# Patient Record
Sex: Female | Born: 1940 | Race: White | Hispanic: No | Smoking: Never smoker
Health system: Southern US, Community
[De-identification: ages and names within clinical notes are randomized; demographics above are authoritative.]

## PROBLEM LIST (undated history)

## (undated) DIAGNOSIS — T7840XA Allergy, unspecified, initial encounter: Secondary | ICD-10-CM

## (undated) DIAGNOSIS — E785 Hyperlipidemia, unspecified: Secondary | ICD-10-CM

## (undated) DIAGNOSIS — M129 Arthropathy, unspecified: Secondary | ICD-10-CM

## (undated) DIAGNOSIS — I10 Essential (primary) hypertension: Secondary | ICD-10-CM

## (undated) DIAGNOSIS — J45909 Unspecified asthma, uncomplicated: Secondary | ICD-10-CM

## (undated) DIAGNOSIS — K449 Diaphragmatic hernia without obstruction or gangrene: Secondary | ICD-10-CM

## (undated) DIAGNOSIS — M1990 Unspecified osteoarthritis, unspecified site: Secondary | ICD-10-CM

## (undated) DIAGNOSIS — F411 Generalized anxiety disorder: Secondary | ICD-10-CM

## (undated) HISTORY — DX: Allergy, unspecified, initial encounter: T78.40XA

## (undated) HISTORY — DX: Generalized anxiety disorder: F41.1

## (undated) HISTORY — PX: TONSILLECTOMY: SUR1361

## (undated) HISTORY — DX: Arthropathy, unspecified: M12.9

## (undated) HISTORY — PX: COLONOSCOPY: SHX174

## (undated) HISTORY — DX: Essential (primary) hypertension: I10

## (undated) HISTORY — PX: LUMBAR DISC SURGERY: SHX700

## (undated) HISTORY — DX: Hyperlipidemia, unspecified: E78.5

## (undated) HISTORY — PX: ABDOMINAL HYSTERECTOMY: SHX81

## (undated) HISTORY — PX: CARPAL TUNNEL RELEASE: SHX101

## (undated) HISTORY — PX: APPENDECTOMY: SHX54

## (undated) HISTORY — DX: Unspecified asthma, uncomplicated: J45.909

## (undated) HISTORY — DX: Diaphragmatic hernia without obstruction or gangrene: K44.9

## (undated) HISTORY — DX: Unspecified osteoarthritis, unspecified site: M19.90

---

## 1999-07-16 ENCOUNTER — Other Ambulatory Visit: Admission: RE | Admit: 1999-07-16 | Discharge: 1999-07-16 | Payer: Self-pay | Admitting: Internal Medicine

## 1999-07-16 ENCOUNTER — Encounter (INDEPENDENT_AMBULATORY_CARE_PROVIDER_SITE_OTHER): Payer: Self-pay | Admitting: Specialist

## 2001-03-30 ENCOUNTER — Other Ambulatory Visit: Admission: RE | Admit: 2001-03-30 | Discharge: 2001-03-30 | Payer: Self-pay | Admitting: Obstetrics and Gynecology

## 2002-03-08 ENCOUNTER — Other Ambulatory Visit: Admission: RE | Admit: 2002-03-08 | Discharge: 2002-03-08 | Payer: Self-pay | Admitting: Obstetrics and Gynecology

## 2002-03-11 ENCOUNTER — Emergency Department (HOSPITAL_COMMUNITY): Admission: EM | Admit: 2002-03-11 | Discharge: 2002-03-12 | Payer: Self-pay | Admitting: *Deleted

## 2008-03-01 ENCOUNTER — Encounter (INDEPENDENT_AMBULATORY_CARE_PROVIDER_SITE_OTHER): Payer: Self-pay | Admitting: Family Medicine

## 2008-03-01 ENCOUNTER — Ambulatory Visit: Payer: Self-pay

## 2008-09-25 ENCOUNTER — Telehealth: Payer: Self-pay | Admitting: Internal Medicine

## 2008-10-01 DIAGNOSIS — I10 Essential (primary) hypertension: Secondary | ICD-10-CM | POA: Insufficient documentation

## 2008-10-01 DIAGNOSIS — E785 Hyperlipidemia, unspecified: Secondary | ICD-10-CM | POA: Insufficient documentation

## 2008-10-01 DIAGNOSIS — K6289 Other specified diseases of anus and rectum: Secondary | ICD-10-CM | POA: Insufficient documentation

## 2008-10-01 DIAGNOSIS — R197 Diarrhea, unspecified: Secondary | ICD-10-CM | POA: Insufficient documentation

## 2008-10-07 ENCOUNTER — Ambulatory Visit: Payer: Self-pay | Admitting: Internal Medicine

## 2008-10-07 DIAGNOSIS — F411 Generalized anxiety disorder: Secondary | ICD-10-CM | POA: Insufficient documentation

## 2008-10-07 DIAGNOSIS — M129 Arthropathy, unspecified: Secondary | ICD-10-CM | POA: Insufficient documentation

## 2008-10-08 LAB — CONVERTED CEMR LAB: Tissue Transglutaminase Ab, IgA: 0.1 units (ref <7)

## 2008-10-15 ENCOUNTER — Encounter: Payer: Self-pay | Admitting: Internal Medicine

## 2008-10-15 ENCOUNTER — Ambulatory Visit: Payer: Self-pay | Admitting: Internal Medicine

## 2008-10-16 ENCOUNTER — Telehealth: Payer: Self-pay | Admitting: Internal Medicine

## 2008-10-17 ENCOUNTER — Encounter: Payer: Self-pay | Admitting: Internal Medicine

## 2008-10-18 ENCOUNTER — Ambulatory Visit (HOSPITAL_COMMUNITY): Admission: RE | Admit: 2008-10-18 | Discharge: 2008-10-18 | Payer: Self-pay | Admitting: Internal Medicine

## 2008-10-23 ENCOUNTER — Telehealth: Payer: Self-pay | Admitting: Internal Medicine

## 2008-10-24 ENCOUNTER — Ambulatory Visit: Payer: Self-pay | Admitting: Internal Medicine

## 2008-10-28 ENCOUNTER — Encounter: Payer: Self-pay | Admitting: Internal Medicine

## 2010-10-16 IMAGING — RF DG SMALL BOWEL
9 series · 9 of 9 positions shown · non-contrast
Comparison: None

CLINICAL DATA: Abdominal pain, diarrhea.

SMALL BOWEL SERIES
TECHNIQUE: Following ingestion of a mixture of thin barium and
Entero Vu, serial small bowel images were obtained including spot
views of the terminal ileum.
Fluoroscopy time:  1.4 minutes.

[Series 1: run · 1 of 1 slices shown (1 of 9)]
[im 1/1]
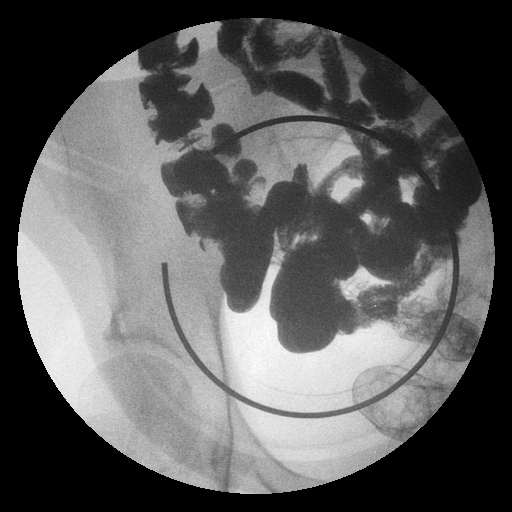

[Series 2: run · 1 of 1 slices shown (2 of 9)]
[im 1/1]
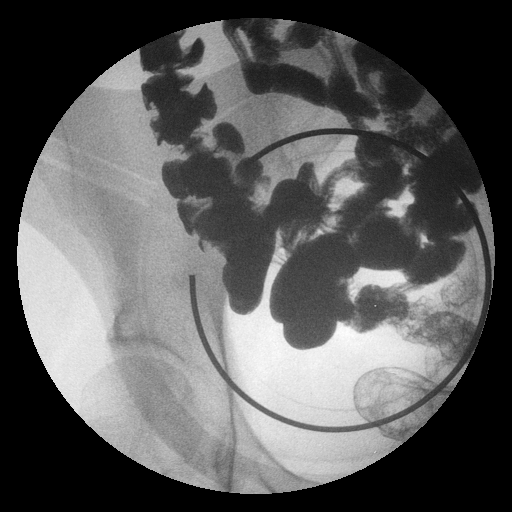

[Series 3: run · 1 of 1 slices shown (3 of 9)]
[im 1/1]
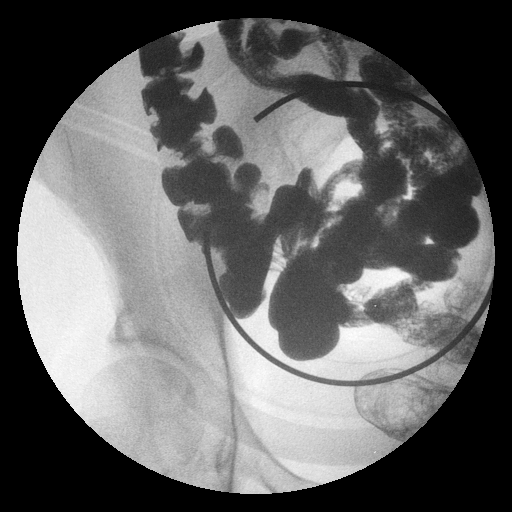

[Series 4: run · 1 of 1 slices shown (4 of 9)]
[im 1/1]
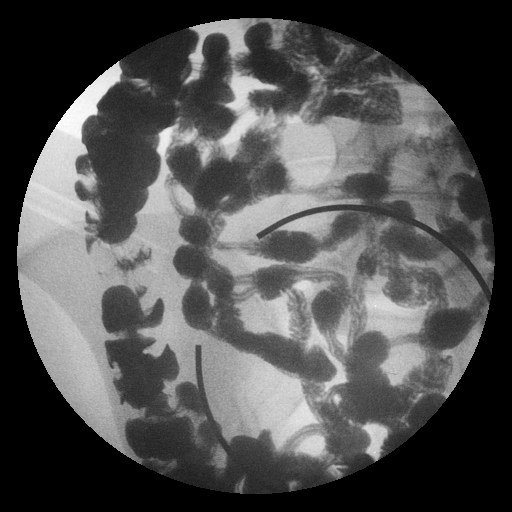

[Series 5: run · 1 of 1 slices shown (5 of 9)]
[im 1/1]
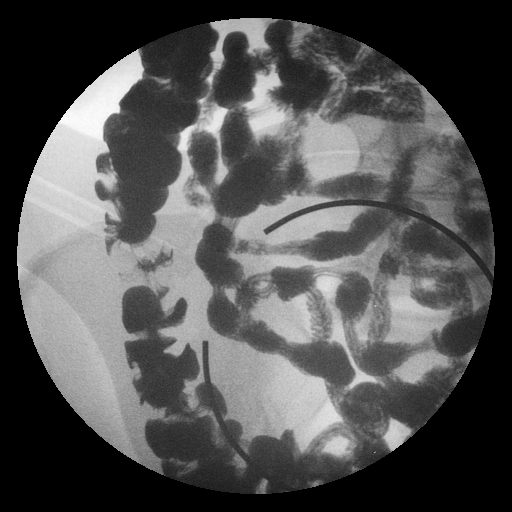

[Series 6: run · 1 of 1 slices shown (6 of 9)]
[im 1/1]
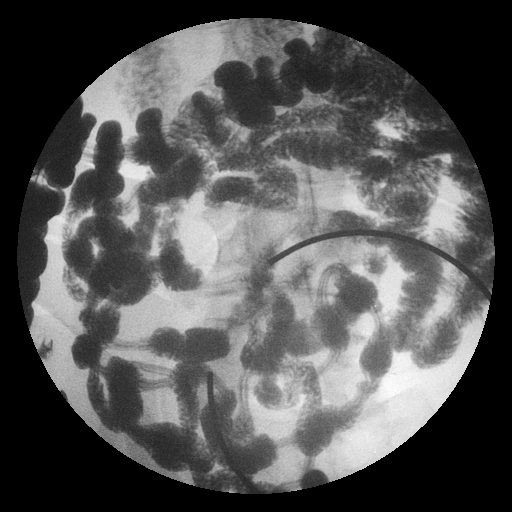

[Series 7: run · 1 of 1 slices shown (7 of 9)]
[im 1/1]
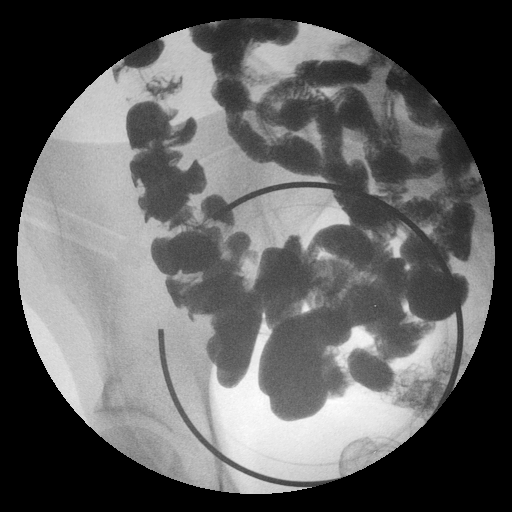

[Series 8: run · 1 of 1 slices shown (8 of 9)]
[im 1/1]
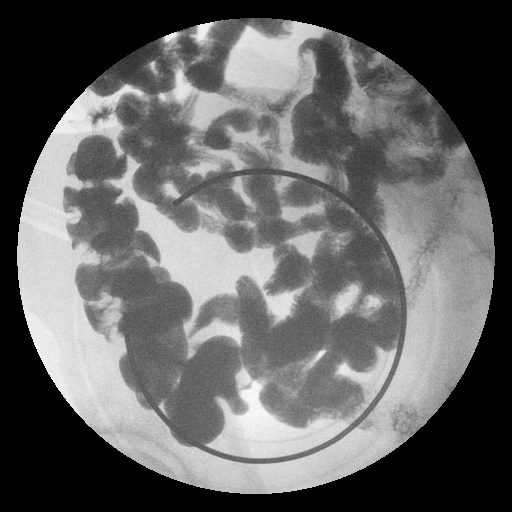

[Series 9: run · 1 of 1 slices shown (9 of 9)]
[im 1/1]
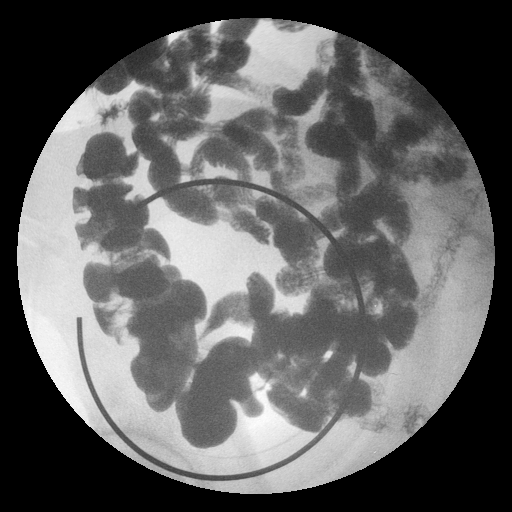

[9 of 9 positions shown; findings below may reference images not displayed]

FINDINGS: There is a normal bowel gas pattern.  On the 30-minute
image, contrast material is already into the colon and into the
rectum.  Small bowel is decompressed and grossly unremarkable.
Spot images of the terminal ileum show no abnormality.
IMPRESSION: Very rapid transit to the colon, with contrast all the way into the
rectum on the 30-minute film.  Otherwise no abnormality.

## 2012-12-06 ENCOUNTER — Encounter: Payer: Self-pay | Admitting: Cardiology

## 2012-12-06 ENCOUNTER — Encounter: Payer: Self-pay | Admitting: *Deleted

## 2012-12-07 ENCOUNTER — Encounter: Payer: Self-pay | Admitting: Cardiology

## 2012-12-07 ENCOUNTER — Ambulatory Visit (INDEPENDENT_AMBULATORY_CARE_PROVIDER_SITE_OTHER): Payer: Medicare Other | Admitting: Cardiology

## 2012-12-07 ENCOUNTER — Encounter (INDEPENDENT_AMBULATORY_CARE_PROVIDER_SITE_OTHER): Payer: Self-pay

## 2012-12-07 VITALS — BP 140/70 | HR 70 | Ht 62.0 in | Wt 161.0 lb

## 2012-12-07 DIAGNOSIS — I10 Essential (primary) hypertension: Secondary | ICD-10-CM

## 2012-12-07 DIAGNOSIS — R079 Chest pain, unspecified: Secondary | ICD-10-CM | POA: Insufficient documentation

## 2012-12-07 DIAGNOSIS — E785 Hyperlipidemia, unspecified: Secondary | ICD-10-CM

## 2012-12-07 NOTE — Patient Instructions (Signed)
Your physician recommends that you schedule a follow-up appointment in: 6 WEEKS WITH DR Jens Som  Your physician has requested that you have en exercise stress myoview. For further information please visit [email or web address] Please follow instruction sheet, as given.

## 2012-12-07 NOTE — Progress Notes (Signed)
     HPI: 72 year old female for evaluation of chest pain. No prior cardiac history. Echocardiogram in February of 2010 showed normal LV function, mild left atrial enlargement and trace mitral regurgitation. Patient states she has had intermittent epigastric pain for approximately 2 years. The pain is described as someone pushing. There is associated dyspnea but no nausea or diaphoresis. The pain does not radiate. It occurs predominantly with exertion. It resolves with rest. However he takes several hours for to be relieved. It can last all day at times. She occasionally takes anxiety medications for this pain. It is not pleuritic, positional or related to food. She denies dyspnea on exertion, orthopnea or syncope.  Current Outpatient Prescriptions  Medication Sig Dispense Refill  . BENICAR 20 MG tablet 1 tab daily      . FLUoxetine (PROZAC) 40 MG capsule 1 tab daily      . guaiFENesin (MUCINEX) 600 MG 12 hr tablet As needed      . pseudoephedrine-acetaminophen (TYLENOL SINUS) 30-500 MG TABS Take 1 tablet by mouth every 4 (four) hours as needed.      . rosuvastatin (CRESTOR) 5 MG tablet Take 5 mg by mouth daily.       No current facility-administered medications for this visit.    No Known Allergies  Past Medical History  Diagnosis Date  . HYPERLIPIDEMIA   . HYPERTENSION   . ANXIETY   . ARTHRITIS     Past Surgical History  Procedure Laterality Date  . Back surgery    . Abdominal hysterectomy    . Tonsillectomy    . Appendectomy      History   Social History  . Marital Status: Married    Spouse Name: N/A    Number of Children: 3  . Years of Education: N/A   Occupational History  . Not on file.   Social History Main Topics  . Smoking status: Never Smoker   . Smokeless tobacco: Not on file  . Alcohol Use: No  . Drug Use: Not on file  . Sexual Activity: Not on file   Other Topics Concern  . Not on file   Social History Narrative  . No narrative on file    Family  History  Problem Relation Age of Onset  . CAD Father     MI in his 74s  . CAD Brother     MI in his 30s  . CAD Sister     ROS: no fevers or chills, productive cough, hemoptysis, dysphasia, odynophagia, melena, hematochezia, dysuria, hematuria, rash, seizure activity, orthopnea, PND, pedal edema, claudication. Remaining systems are negative.  Physical Exam:   Blood pressure 140/70, pulse 70, height 5\' 2"  (1.575 m), weight 161 lb (73.029 kg).  General:  Well developed/well nourished in NAD Skin warm/dry Patient not depressed No peripheral clubbing Back-normal HEENT-normal/normal eyelids Neck supple/normal carotid upstroke bilaterally; no bruits; no JVD; no thyromegaly chest - CTA/ normal expansion CV - RRR/normal S1 and S2; no murmurs, rubs or gallops;  PMI nondisplaced Abdomen -NT/ND, no HSM, no mass, + bowel sounds, no bruit 2+ femoral pulses, no bruits Ext-no edema, chords, 2+ DP Neuro-grossly nonfocal  ECG NSR with no ST changes.

## 2012-12-07 NOTE — Assessment & Plan Note (Signed)
Continue statin. 

## 2012-12-07 NOTE — Assessment & Plan Note (Signed)
Continue present blood pressure medications. 

## 2012-12-07 NOTE — Assessment & Plan Note (Signed)
Symptoms have both typical and atypical features. However her symptoms can last all day at times. Electrocardiogram is normal. She has a strong family history of coronary disease. Plan stress Myoview with low threshold for catheterization.

## 2012-12-18 ENCOUNTER — Encounter: Payer: Self-pay | Admitting: Cardiology

## 2012-12-26 ENCOUNTER — Ambulatory Visit (HOSPITAL_COMMUNITY): Payer: Medicare Other | Attending: Cardiology | Admitting: Radiology

## 2012-12-26 ENCOUNTER — Encounter: Payer: Self-pay | Admitting: Cardiology

## 2012-12-26 VITALS — BP 202/92 | Ht 60.0 in | Wt 158.0 lb

## 2012-12-26 DIAGNOSIS — R079 Chest pain, unspecified: Secondary | ICD-10-CM

## 2012-12-26 DIAGNOSIS — I10 Essential (primary) hypertension: Secondary | ICD-10-CM | POA: Insufficient documentation

## 2012-12-26 DIAGNOSIS — R0602 Shortness of breath: Secondary | ICD-10-CM | POA: Insufficient documentation

## 2012-12-26 DIAGNOSIS — Z8249 Family history of ischemic heart disease and other diseases of the circulatory system: Secondary | ICD-10-CM | POA: Insufficient documentation

## 2012-12-26 MED ORDER — REGADENOSON 0.4 MG/5ML IV SOLN
0.4000 mg | Freq: Once | INTRAVENOUS | Status: AC
  Administered 2012-12-26: 0.4 mg via INTRAVENOUS

## 2012-12-26 MED ORDER — TECHNETIUM TC 99M SESTAMIBI GENERIC - CARDIOLITE
33.0000 | Freq: Once | INTRAVENOUS | Status: AC | PRN
  Administered 2012-12-26: 33 via INTRAVENOUS

## 2012-12-26 NOTE — Progress Notes (Signed)
[facility] SITE 3 NUCLEAR MED [address] [place], [place] [address] [phone]    Cardiology Nuclear Med Study  Stacey Patterson is a 72 y.o. female     MRN : [id number]     DOB: [birth year]  Procedure Date: 12/26/2012  Nuclear Med Background Indication for Stress Test:  Evaluation for Ischemia History:  '10 Echo: EF=60-65%;MPI: EF=78%,normal with breast attenuation Cardiac Risk Factors: Family History - CAD, Hypertension and Lipids  Symptoms:  Chest Pain at Rest and with Exertion (last date of chest discomfort yesterday) and SOB   Nuclear Pre-Procedure Caffeine/Decaff Intake:  None NPO After: 7:00pm   Lungs:  clear O2 Sat: 98% on room air. IV 0.9% NS with Angio Cath:  22g  IV Site: R Wrist  IV Started by:  Cathlyn Parsons, RN  Chest Size (in):  36 Cup Size: DDD  Height: 5' (1.524 m)  Weight:  158 lb (71.668 kg)  BMI:  Body mass index is 30.86 kg/(m^2). Tech Comments:  Patient BP elevated and protocol changed to Lexiscan.    Nuclear Med Study 1 or 2 day study: 2 day  Stress Test Type:  Lexiscan  Reading MD: Olga Millers, MD  Order Authorizing Provider:  Ripley Fraise  Resting Radionuclide: Technetium 10m Sestamibi  Resting Radionuclide Dose: 33.0 mCi on 01/02/13   Stress Radionuclide:  Technetium 57m Sestamibi  Stress Radionuclide Dose: 33.0 mCi on 12/26/12           Stress Protocol Rest HR: 60 Stress HR: 100  Rest BP: 202/92 Stress BP: 200/67  Exercise Time (min): n/a METS: n/a   Predicted Max HR: 149 bpm % Max HR: 67.11 bpm Rate Pressure Product: 01027   Dose of Adenosine (mg):  n/a Dose of Lexiscan: 0.4 mg  Dose of Atropine (mg): n/a Dose of Dobutamine: n/a mcg/kg/min (at max HR)  Stress Test Technologist: Cathlyn Parsons, RN  Nuclear Technologist:  Domenic Polite, CNMT     Rest Procedure:  Myocardial perfusion imaging was performed at rest 45 minutes following the intravenous administration of Technetium 84m Sestamibi. Rest ECG:  NSR - Normal EKG  Stress Procedure:  The patient received IV Lexiscan 0.4 mg over 15-seconds.  Technetium 49m Sestamibi injected at 30-seconds.  Quantitative spect images were obtained after a 45 minute delay. Stress ECG: No significant change from baseline ECG  QPS Raw Data Images:  Normal; no motion artifact; normal heart/lung ratio. Stress Images:  Normal homogeneous uptake in all areas of the myocardium. Rest Images:  Normal homogeneous uptake in all areas of the myocardium. Subtraction (SDS):  No evidence of ischemia. Transient Ischemic Dilatation (Normal <1.22):  1.01 Lung/Heart Ratio (Normal <0.45):  0.32  Quantitative Gated Spect Images QGS EDV:  76 ml QGS ESV:  24 ml  Impression Exercise Capacity:  Lexiscan with no exercise. BP Response:  Hypertensive blood pressure response. Clinical Symptoms:  There is dyspnea. ECG Impression:  No significant ST segment change suggestive of ischemia. Comparison with Prior Nuclear Study: No images to compare  Overall Impression:  Normal stress nuclear study.  LV Ejection Fraction: 68%.  LV Wall Motion:  NL LV Function; NL Wall Motion   Charlton Haws

## 2013-01-02 ENCOUNTER — Ambulatory Visit (HOSPITAL_COMMUNITY): Payer: Medicare Other | Attending: Cardiovascular Disease

## 2013-01-02 ENCOUNTER — Encounter: Payer: Self-pay | Admitting: Cardiovascular Disease

## 2013-01-02 DIAGNOSIS — R0989 Other specified symptoms and signs involving the circulatory and respiratory systems: Secondary | ICD-10-CM

## 2013-01-02 MED ORDER — TECHNETIUM TC 99M SESTAMIBI GENERIC - CARDIOLITE
33.0000 | Freq: Once | INTRAVENOUS | Status: AC | PRN
  Administered 2013-01-02: 33 via INTRAVENOUS

## 2013-01-22 ENCOUNTER — Encounter: Payer: Self-pay | Admitting: Cardiology

## 2013-01-22 ENCOUNTER — Ambulatory Visit (INDEPENDENT_AMBULATORY_CARE_PROVIDER_SITE_OTHER): Payer: Private Health Insurance - Indemnity | Admitting: Cardiology

## 2013-01-22 ENCOUNTER — Encounter: Payer: Self-pay | Admitting: Internal Medicine

## 2013-01-22 VITALS — BP 144/74 | HR 75 | Ht 62.0 in | Wt 161.0 lb

## 2013-01-22 DIAGNOSIS — I10 Essential (primary) hypertension: Secondary | ICD-10-CM

## 2013-01-22 DIAGNOSIS — E785 Hyperlipidemia, unspecified: Secondary | ICD-10-CM

## 2013-01-22 DIAGNOSIS — R079 Chest pain, unspecified: Secondary | ICD-10-CM

## 2013-01-22 MED ORDER — ESOMEPRAZOLE MAGNESIUM 40 MG PO CPDR: 40.0000 mg | DELAYED_RELEASE_CAPSULE | Freq: Every day | ORAL | Status: DC

## 2013-01-22 NOTE — Progress Notes (Signed)
      HPI: FU chest pain. No prior cardiac history. Echocardiogram in February of 2010 showed normal LV function, mild left atrial enlargement and trace mitral regurgitation. I initially saw her in December of 2014 and scheduled a nuclear study. This showed an ejection fraction of 68% and normal perfusion. Since I saw her last, she continues to have chest pain. It begins in the epigastric area and radiates to her neck. It is described as a burning pain. It is not exertional, related to food and is not pleuritic. There is occasional diaphoresis but no dyspnea or nausea. Her pain gets worse with lying flat, swallowing and particularly with drinking hot liquids. It can last all day at times. She states she has been belching quite frequently.   Current Outpatient Prescriptions  Medication Sig Dispense Refill  . BENICAR 20 MG tablet 1 tab daily      . FLUoxetine (PROZAC) 40 MG capsule 1 tab daily      . guaiFENesin (MUCINEX) 600 MG 12 hr tablet As needed      . pseudoephedrine-acetaminophen (TYLENOL SINUS) 30-500 MG TABS Take 1 tablet by mouth every 4 (four) hours as needed.      . rosuvastatin (CRESTOR) 5 MG tablet Take 5 mg by mouth daily.       No current facility-administered medications for this visit.     Past Medical History  Diagnosis Date  . HYPERLIPIDEMIA   . HYPERTENSION   . ANXIETY   . ARTHRITIS     Past Surgical History  Procedure Laterality Date  . Back surgery    . Abdominal hysterectomy    . Tonsillectomy    . Appendectomy      History   Social History  . Marital Status: Married    Spouse Name: N/A    Number of Children: 3  . Years of Education: N/A   Occupational History  . Not on file.   Social History Main Topics  . Smoking status: Never Smoker   . Smokeless tobacco: Not on file  . Alcohol Use: No  . Drug Use: Not on file  . Sexual Activity: Not on file   Other Topics Concern  . Not on file   Social History Narrative  . No narrative on file     ROS: no fevers or chills, productive cough, hemoptysis, dysphasia, odynophagia, melena, hematochezia, dysuria, hematuria, rash, seizure activity, orthopnea, PND, pedal edema, claudication. Remaining systems are negative.  Physical Exam: Well-developed well-nourished in no acute distress.  Skin is warm and dry.  HEENT is normal.  Neck is supple.  Chest is clear to auscultation with normal expansion.  Cardiovascular exam is regular rate and rhythm.  Abdominal exam nontender or distended. No masses palpated. Extremities show no edema. neuro grossly intact

## 2013-01-22 NOTE — Assessment & Plan Note (Signed)
Continue statin. 

## 2013-01-22 NOTE — Patient Instructions (Signed)
Your physician wants you to follow-up in: 3 [place] will receive a reminder letter in the mail two months in advance. If you don't receive a letter, please call our office to schedule the follow-up appointment.   START NEXIUM 40 MG ONCE DAILY  REFERRAL TO [facility] FOR NON-CARDIAC CHEST PAIN

## 2013-01-22 NOTE — Assessment & Plan Note (Signed)
Blood pressure controlled. Continue present medications. 

## 2013-01-22 NOTE — Assessment & Plan Note (Addendum)
Patient continues to have chest pain/epigastric pain. Her nuclear study was normal. Her symptoms now sound more GI related as they increase with lying flat, swallowing and drinking hot liquids. Her electrocardiogram shows no ST changes. I will add Nexium 40 mg daily. Schedule GI evaluation. If symptoms persist despite Nexium and GI evaluation negative we can consider more definitive cardiac evaluation such as catheterization. However her symptoms again sounds more GI related.

## 2013-01-25 ENCOUNTER — Encounter: Payer: Self-pay | Admitting: *Deleted

## 2013-03-20 ENCOUNTER — Ambulatory Visit (INDEPENDENT_AMBULATORY_CARE_PROVIDER_SITE_OTHER): Payer: Medicare HMO | Admitting: Internal Medicine

## 2013-03-20 ENCOUNTER — Encounter: Payer: Self-pay | Admitting: Internal Medicine

## 2013-03-20 VITALS — BP 132/70 | HR 64 | Ht 61.75 in | Wt 160.1 lb

## 2013-03-20 DIAGNOSIS — R0789 Other chest pain: Secondary | ICD-10-CM

## 2013-03-20 DIAGNOSIS — R079 Chest pain, unspecified: Secondary | ICD-10-CM

## 2013-03-20 NOTE — Progress Notes (Signed)
Stacey Patterson 1940-05-10 [id number]  Note: This dictation was prepared with Dragon digital system. Any transcriptional errors that result from this procedure are unintentional.   History of Present Illness:  This is a 73 year old white female with substernal and subxiphoid chest pain occurring on a daily basis mostly during the day, not at night. She has been evaluated by Dr.Crensh noncardiac. She is here to talk about it. She has baw who feels that her chest pain and epigastric pain is non cardiac.She beeen started on Nexium 40 mg daily but does not take it because it is very expensive. 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 Colonoscopies in 2001 and 2010 were normal. She had a small bowel follow through which showed a rapid transit time of 30 minutes. She has been under a lot of stress with family sickness. She feels that the chest pain is worse with stress. It does get better with antacids or with Nexium. She denies heartburn. She has occasional hoarseness and cough. She has dysphagia intermittently to certain foods and also sometimes to liquids.    Past Medical History  Diagnosis Date  . HYPERLIPIDEMIA   . HYPERTENSION   . ANXIETY   . ARTHRITIS     Past Surgical History  Procedure Laterality Date  . Lumbar disc surgery    . Abdominal hysterectomy    . Tonsillectomy    . Appendectomy      No Known Allergies  Family history and social history have been reviewed.  Review of Systems: Positive for dysphagia.  The remainder of the 10 point ROS is negative except as outlined in the H&P  Physical Exam: General Appearance Well developed, in no distress Eyes  Non icteric  HEENT  Non traumatic, normocephalic  Mouth No lesion, tongue papillated, no cheilosis Neck Supple without adenopathy, thyroid not enlarged, no carotid bruits, no JVD Lungs Clear to auscultation bilaterally COR Normal S1, normal S2, regular rhythm, no murmur, quiet  precordium Abdomen very tender in the subxiphoid area and midline. Normal left and right lower quadrants. Normoactive bowel sounds. No scars. Liver edge at costal margin Rectal not done Extremities  No pedal edema Skin No lesions Neurological Alert and oriented x 3 Psychological Normal mood and affect  Assessment and Plan:   Problem #1 Noncardiac chest pain and epigastric pain which could be possibly related to gastroesophageal reflux, hiatal hernia or to esophageal spasm. She has had some response to acid reducing agents but does not take them on a regular basis. The dysphagia to liquids and solids may suggest esophageal dysmotility. Esophageal stricture is also a possibility. We will proceed with an upper endoscopy first and possible dilatation if necessary. She would like to wait  on findings on the upper endoscopy.before committing to medications.  We will also decide about stress management. She is currently on Prozac 40 mg daily.    Delfin Edis 03/20/2013

## 2013-03-20 NOTE — Patient Instructions (Addendum)
You have been scheduled for an endoscopy with propofol. Please follow written instructions given to you at your visit today. If you use inhalers (even only as needed), please bring them with you on the day of your procedure. Your physician has requested that you go to www.startemmi.com and enter the access code given to you at your visit today. This web site gives a general overview about your procedure. However, you should still follow specific instructions given to you by our office regarding your preparation for the procedure.  CC:Dr Kennith Maes, Dr Kirk Ruths

## 2013-03-26 ENCOUNTER — Encounter: Payer: Self-pay | Admitting: Internal Medicine

## 2013-03-28 ENCOUNTER — Encounter: Payer: Self-pay | Admitting: Internal Medicine

## 2013-03-28 ENCOUNTER — Ambulatory Visit (AMBULATORY_SURGERY_CENTER): Payer: Medicare HMO | Admitting: Internal Medicine

## 2013-03-28 VITALS — BP 145/62 | HR 54 | Temp 97.4°F | Resp 14 | Ht 61.75 in | Wt 160.0 lb

## 2013-03-28 DIAGNOSIS — K2940 Chronic atrophic gastritis without bleeding: Secondary | ICD-10-CM

## 2013-03-28 DIAGNOSIS — K319 Disease of stomach and duodenum, unspecified: Secondary | ICD-10-CM

## 2013-03-28 DIAGNOSIS — K2990 Gastroduodenitis, unspecified, without bleeding: Secondary | ICD-10-CM

## 2013-03-28 DIAGNOSIS — K2970 Gastritis, unspecified, without bleeding: Secondary | ICD-10-CM

## 2013-03-28 DIAGNOSIS — R0789 Other chest pain: Secondary | ICD-10-CM

## 2013-03-28 MED ORDER — SODIUM CHLORIDE 0.9 % IV SOLN: 500.0000 mL | INTRAVENOUS | Status: DC

## 2013-03-28 MED ORDER — RANITIDINE HCL 150 MG PO TABS: 150.0000 mg | ORAL_TABLET | Freq: Every day | ORAL | Status: DC

## 2013-03-28 NOTE — Progress Notes (Signed)
Report to pacu rn, vss, bbs=clear 

## 2013-03-28 NOTE — Patient Instructions (Signed)
YOU HAD AN ENDOSCOPIC PROCEDURE TODAY AT [facility]: Refer to the procedure report that was given to you for any specific questions about what was found during the examination.  If the procedure report does not answer your questions, please call your gastroenterologist to clarify.  If you requested that your care partner not be given the details of your procedure findings, then the procedure report has been included in a sealed envelope for you to review at your convenience later.  YOU SHOULD EXPECT: Some feelings of bloating in the abdomen. Passage of more gas than usual.  Walking can help get rid of the air that was put into your GI tract during the procedure and reduce the bloating. If you had a lower endoscopy (such as a colonoscopy or flexible sigmoidoscopy) you may notice spotting of blood in your stool or on the toilet paper. If you underwent a bowel prep for your procedure, then you may not have a normal bowel movement for a few days.  DIET:  Nothing to eat or drink until 10:00. 10:00 until 11:00 only clear liquids. After 11:00 until morning only soft foods. Resume your normal diet in the AM.  ACTIVITY: Your care partner should take you home directly after the procedure.  You should plan to take it easy, moving slowly for the rest of the day.  You can resume normal activity the day after the procedure however you should NOT DRIVE or use heavy machinery for 24 hours (because of the sedation medicines used during the test).    SYMPTOMS TO REPORT IMMEDIATELY: A gastroenterologist can be reached at any hour.  During normal business hours, 8:30 AM to 5:00 PM Monday through Friday, call [phone].  After hours and on weekends, please call the GI answering service at [phone] who will take a message and have the physician on call contact you.   Following upper endoscopy (EGD)  Vomiting of blood or coffee ground material  New chest pain or pain under the shoulder  blades  Painful or persistently difficult swallowing  New shortness of breath  Fever of 100F or higher  Black, tarry-looking stools  FOLLOW UP: If any biopsies were taken you will be contacted by phone or by letter within the next 1-3 weeks.  Call your gastroenterologist if you have not heard about the biopsies in 3 weeks.  Our staff will call the home number listed on your records the next business day following your procedure to check on you and address any questions or concerns that you may have at that time regarding the information given to you following your procedure. This is a courtesy call and so if there is no answer at the home number and we have not heard from you through the emergency physician on call, we will assume that you have returned to your regular daily activities without incident.  SIGNATURES/CONFIDENTIALITY: You and/or your care partner have signed paperwork which will be entered into your electronic medical record.  These signatures attest to the fact that that the information above on your After Visit Summary has been reviewed and is understood.  Full responsibility of the confidentiality of this discharge information lies with you and/or your care-partner.

## 2013-03-28 NOTE — Progress Notes (Signed)
Patient complains of left ear discomfort radiating into jaw. Patient would not rate pain. Informed dr. Olevia Perches and Jacqlyn Larsen crna of patient complaint. No difficulties during procedure with egd or dilatation. Patient was noted to have a small mouth, may be related to bite block. MD suggesting warm compress, Tylenol. Also clarified that the only GERD medication is Zantac. Patient informed of above and verbalized agreement.

## 2013-03-28 NOTE — Progress Notes (Signed)
Called to room to assist during endoscopic procedure.  Patient ID and intended procedure confirmed with present staff. Received instructions for my participation in the procedure from the performing physician.  

## 2013-03-28 NOTE — Op Note (Signed)
[facility]  [address] [place], [address]   ENDOSCOPY PROCEDURE REPORT  PATIENT: Stacey, Patterson  MR#: [id number] BIRTHDATE: September 06, 1940 , 72  yrs. old GENDER: Female ENDOSCOPIST: Lafayette Dragon, MD REFERRED BY:  Dr Kennith Maes PROCEDURE DATE:  03/28/2013 PROCEDURE:  EGD w/ biopsy and Maloney dilation of esophagus ASA CLASS:     Class II INDICATIONS:  noncardiac chest pain.  Responsive to PPI. Intermittent dysphagia to solids and liquids.  Pain worse with stress. MEDICATIONS: MAC sedation, administered by CRNA and propofol (Diprivan) 100mg  IV TOPICAL ANESTHETIC: none  DESCRIPTION OF PROCEDURE: After the risks benefits and alternatives of the procedure were thoroughly explained, informed consent was obtained.  The LB [id number] endoscope was introduced through the mouth and advanced to the second portion of the duodenum. Without limitations.  The instrument was slowly withdrawn as the mucosa was fully examined.      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 There was mild resistance at the level of lower esophageal sphincter which opened up with the light pressure. There was no stricture. The Z line appeared normal. There was a 2-3 cm reducible hiatal hernia Stomach: Gastric mucosa was diffusely erythematous particularly in the gastric antrum with multiple superficial erosions. Biopsies were obtained to rule out H. pylori. Gastric outlet was unremarkable. Retroflexion of the scope revealed normal fundus and cardia Duodenum: Duodenal bulb and descending duodenum was normal Maloney dilator 26 [place] passed through the esophagus without difficulty. There was no blood on the dilator[         The scope was then withdrawn from the patient and the procedure completed.  COMPLICATIONS: There were no complications. 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 Status post passage of the 58 [place] Maloney dilator Small 2 cm sliding hiatal hernia Mild to moderately severe antral gastritis. Status post biopsies to rule out H. 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  eSigned:  Lafayette Dragon, MD 03/28/2013 9:01 AM   CC:  PATIENT NAME:  Stacey, Patterson MR#: [id number]

## 2013-03-29 ENCOUNTER — Telehealth: Payer: Self-pay

## 2013-03-29 NOTE — Telephone Encounter (Signed)
  Follow up Call-  Call back number 03/28/2013  Post procedure Call Back phone  # [phone]  Permission to leave phone message No     Patient questions:  Do you have a fever, pain , or abdominal swelling? no Pain Score  0 *  Have you tolerated food without any problems? yes  Have you been able to return to your normal activities? yes  Do you have any questions about your discharge instructions: Diet   no Medications  no Follow up visit  no  Do you have questions or concerns about your Care? no  Actions: * If pain score is 4 or above: No action needed, pain <4.

## 2013-04-02 ENCOUNTER — Encounter: Payer: Self-pay | Admitting: Internal Medicine

## 2013-04-03 ENCOUNTER — Encounter: Payer: Self-pay | Admitting: *Deleted

## 2013-04-23 ENCOUNTER — Ambulatory Visit (INDEPENDENT_AMBULATORY_CARE_PROVIDER_SITE_OTHER): Payer: Medicare HMO | Admitting: Cardiology

## 2013-04-23 ENCOUNTER — Encounter: Payer: Self-pay | Admitting: Cardiology

## 2013-04-23 VITALS — BP 118/62 | HR 89 | Ht 61.75 in | Wt 162.0 lb

## 2013-04-23 DIAGNOSIS — R079 Chest pain, unspecified: Secondary | ICD-10-CM

## 2013-04-23 DIAGNOSIS — E785 Hyperlipidemia, unspecified: Secondary | ICD-10-CM

## 2013-04-23 DIAGNOSIS — I10 Essential (primary) hypertension: Secondary | ICD-10-CM

## 2013-04-23 NOTE — Progress Notes (Signed)
      HPI: FU chest pain. Echocardiogram in February of 2010 showed normal LV function, mild left atrial enlargement and trace mitral regurgitation. Nuclear study in Dec 2014 showed an ejection fraction of 68% and normal perfusion. CP felt most likely GI; nexium added at last OV. EGD revealed hiatal hernia, esophageal spasm and gastritis. Since I last saw her, She notes some dyspnea on exertion. No orthopnea, PND or pedal edema. She continues to have occasional chest pain associated to stressful situations. It also occurs after eating certain foods. He has improved with the addition of Zantac. She does not have exertional chest pain and actually exertion and improves her symptoms.  Current Outpatient Prescriptions  Medication Sig Dispense Refill  . FLUoxetine (PROZAC) 40 MG capsule 1 tab daily      . guaiFENesin (MUCINEX) 600 MG 12 hr tablet As needed      . loratadine (CLARITIN) 10 MG tablet Take 10 mg by mouth daily.      . ranitidine (ZANTAC) 150 MG tablet Take 1 tablet (150 mg total) by mouth at bedtime.  30 tablet  1  . rosuvastatin (CRESTOR) 5 MG tablet Take 5 mg by mouth daily.      . valsartan-hydrochlorothiazide (DIOVAN-HCT) 320-12.5 MG per tablet Take 1 tablet by mouth daily.        No current facility-administered medications for this visit.     Past Medical History  Diagnosis Date  . HYPERLIPIDEMIA   . HYPERTENSION   . ANXIETY   . ARTHRITIS   . Allergy   . Arthritis     SHOULDERS  . Asthma     AS A CHILD    Past Surgical History  Procedure Laterality Date  . Lumbar disc surgery    . Abdominal hysterectomy    . Tonsillectomy    . Appendectomy    . Colonoscopy    . Carpal tunnel release      History   Social History  . Marital Status: Married    Spouse Name: N/A    Number of Children: 3  . 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 Smoking status: Never Smoker   . Smokeless tobacco: Never Used  .  Alcohol Use: No  . Drug Use: No  . Sexual Activity: Not on file   Other Topics Concern  . Not on file   Social History Narrative  . No narrative on file    ROS: no fevers or chills, productive cough, hemoptysis, dysphasia, odynophagia, melena, hematochezia, dysuria, hematuria, rash, seizure activity, orthopnea, PND, pedal edema, claudication. Remaining systems are negative.  Physical Exam: Well-developed well-nourished in no acute distress.  Skin is warm and dry.  HEENT is normal.  Neck is supple.  Chest is clear to auscultation with normal expansion.  Cardiovascular exam is regular rate and rhythm.  Abdominal exam nontender or distended. No masses palpated. Extremities show no edema. neuro grossly intact  ECG 01/22/2013-sinus rhythm with no ST changes.

## 2013-04-23 NOTE — Assessment & Plan Note (Signed)
Blood pressure controlled. Continue present medications. 

## 2013-04-23 NOTE — Assessment & Plan Note (Signed)
Continue statin. 

## 2013-04-23 NOTE — Patient Instructions (Signed)
Your physician recommends that you schedule a follow-up appointment in: AS NEEDED  

## 2013-04-23 NOTE — Assessment & Plan Note (Signed)
Patient symptoms sound more likely to be stress related and GI related. Previous functional study negative. Will not pursue further cardiac evaluation at this point.

## 2014-10-29 DIAGNOSIS — J309 Allergic rhinitis, unspecified: Secondary | ICD-10-CM | POA: Diagnosis not present

## 2014-11-02 DIAGNOSIS — R0789 Other chest pain: Secondary | ICD-10-CM | POA: Diagnosis not present

## 2014-11-03 DIAGNOSIS — R51 Headache: Secondary | ICD-10-CM | POA: Diagnosis not present

## 2014-11-03 DIAGNOSIS — R079 Chest pain, unspecified: Secondary | ICD-10-CM | POA: Diagnosis not present

## 2014-11-03 DIAGNOSIS — R0789 Other chest pain: Secondary | ICD-10-CM | POA: Diagnosis not present

## 2014-11-19 DIAGNOSIS — M5442 Lumbago with sciatica, left side: Secondary | ICD-10-CM | POA: Diagnosis not present

## 2014-11-25 DIAGNOSIS — Z79899 Other long term (current) drug therapy: Secondary | ICD-10-CM | POA: Diagnosis not present

## 2014-11-25 DIAGNOSIS — E7800 Pure hypercholesterolemia, unspecified: Secondary | ICD-10-CM | POA: Diagnosis not present

## 2014-11-25 DIAGNOSIS — I10 Essential (primary) hypertension: Secondary | ICD-10-CM | POA: Diagnosis not present

## 2014-12-16 DIAGNOSIS — H43813 Vitreous degeneration, bilateral: Secondary | ICD-10-CM | POA: Diagnosis not present

## 2014-12-16 DIAGNOSIS — H35373 Puckering of macula, bilateral: Secondary | ICD-10-CM | POA: Diagnosis not present

## 2014-12-16 DIAGNOSIS — H52223 Regular astigmatism, bilateral: Secondary | ICD-10-CM | POA: Diagnosis not present

## 2014-12-16 DIAGNOSIS — H5203 Hypermetropia, bilateral: Secondary | ICD-10-CM | POA: Diagnosis not present

## 2014-12-16 DIAGNOSIS — H43393 Other vitreous opacities, bilateral: Secondary | ICD-10-CM | POA: Diagnosis not present

## 2014-12-16 DIAGNOSIS — I10 Essential (primary) hypertension: Secondary | ICD-10-CM | POA: Diagnosis not present

## 2014-12-16 DIAGNOSIS — H524 Presbyopia: Secondary | ICD-10-CM | POA: Diagnosis not present

## 2015-03-18 DIAGNOSIS — Z0000 Encounter for general adult medical examination without abnormal findings: Secondary | ICD-10-CM | POA: Diagnosis not present

## 2015-03-18 DIAGNOSIS — I10 Essential (primary) hypertension: Secondary | ICD-10-CM | POA: Diagnosis not present

## 2015-03-18 DIAGNOSIS — Z23 Encounter for immunization: Secondary | ICD-10-CM | POA: Diagnosis not present

## 2015-03-18 DIAGNOSIS — E7800 Pure hypercholesterolemia, unspecified: Secondary | ICD-10-CM | POA: Diagnosis not present

## 2015-03-18 DIAGNOSIS — Z1389 Encounter for screening for other disorder: Secondary | ICD-10-CM | POA: Diagnosis not present

## 2015-03-18 DIAGNOSIS — Z9181 History of falling: Secondary | ICD-10-CM | POA: Diagnosis not present

## 2015-03-18 DIAGNOSIS — R69 Illness, unspecified: Secondary | ICD-10-CM | POA: Diagnosis not present

## 2015-04-08 ENCOUNTER — Encounter: Payer: Self-pay | Admitting: Internal Medicine

## 2015-04-14 DIAGNOSIS — H169 Unspecified keratitis: Secondary | ICD-10-CM | POA: Diagnosis not present

## 2015-06-28 DIAGNOSIS — N3001 Acute cystitis with hematuria: Secondary | ICD-10-CM | POA: Diagnosis not present

## 2015-06-28 DIAGNOSIS — S335XXA Sprain of ligaments of lumbar spine, initial encounter: Secondary | ICD-10-CM | POA: Diagnosis not present

## 2015-07-25 DIAGNOSIS — R69 Illness, unspecified: Secondary | ICD-10-CM | POA: Diagnosis not present

## 2015-07-25 DIAGNOSIS — E785 Hyperlipidemia, unspecified: Secondary | ICD-10-CM | POA: Diagnosis not present

## 2015-07-25 DIAGNOSIS — Z79899 Other long term (current) drug therapy: Secondary | ICD-10-CM | POA: Diagnosis not present

## 2015-07-25 DIAGNOSIS — I10 Essential (primary) hypertension: Secondary | ICD-10-CM | POA: Diagnosis not present

## 2015-08-04 DIAGNOSIS — Z1231 Encounter for screening mammogram for malignant neoplasm of breast: Secondary | ICD-10-CM | POA: Diagnosis not present

## 2015-08-04 DIAGNOSIS — Z01419 Encounter for gynecological examination (general) (routine) without abnormal findings: Secondary | ICD-10-CM | POA: Diagnosis not present

## 2015-08-08 DIAGNOSIS — E784 Other hyperlipidemia: Secondary | ICD-10-CM | POA: Diagnosis not present

## 2015-08-08 DIAGNOSIS — I10 Essential (primary) hypertension: Secondary | ICD-10-CM | POA: Diagnosis not present

## 2015-08-08 DIAGNOSIS — R69 Illness, unspecified: Secondary | ICD-10-CM | POA: Diagnosis not present

## 2015-08-08 DIAGNOSIS — Z0000 Encounter for general adult medical examination without abnormal findings: Secondary | ICD-10-CM | POA: Diagnosis not present

## 2015-08-08 DIAGNOSIS — K219 Gastro-esophageal reflux disease without esophagitis: Secondary | ICD-10-CM | POA: Diagnosis not present

## 2016-01-14 DIAGNOSIS — D0461 Carcinoma in situ of skin of right upper limb, including shoulder: Secondary | ICD-10-CM | POA: Diagnosis not present

## 2016-02-11 DIAGNOSIS — N393 Stress incontinence (female) (male): Secondary | ICD-10-CM | POA: Diagnosis not present

## 2016-02-11 DIAGNOSIS — J329 Chronic sinusitis, unspecified: Secondary | ICD-10-CM | POA: Diagnosis not present

## 2016-02-11 DIAGNOSIS — N39490 Overflow incontinence: Secondary | ICD-10-CM | POA: Diagnosis not present

## 2016-02-11 DIAGNOSIS — R42 Dizziness and giddiness: Secondary | ICD-10-CM | POA: Diagnosis not present

## 2016-04-12 DIAGNOSIS — Z1389 Encounter for screening for other disorder: Secondary | ICD-10-CM | POA: Diagnosis not present

## 2016-04-12 DIAGNOSIS — Z6830 Body mass index (BMI) 30.0-30.9, adult: Secondary | ICD-10-CM | POA: Diagnosis not present

## 2016-04-12 DIAGNOSIS — Z9181 History of falling: Secondary | ICD-10-CM | POA: Diagnosis not present

## 2016-04-12 DIAGNOSIS — I8393 Asymptomatic varicose veins of bilateral lower extremities: Secondary | ICD-10-CM | POA: Diagnosis not present

## 2016-04-26 DIAGNOSIS — L570 Actinic keratosis: Secondary | ICD-10-CM | POA: Diagnosis not present

## 2016-04-26 DIAGNOSIS — L820 Inflamed seborrheic keratosis: Secondary | ICD-10-CM | POA: Diagnosis not present

## 2016-04-27 ENCOUNTER — Encounter: Payer: Self-pay | Admitting: Vascular Surgery

## 2016-05-04 ENCOUNTER — Encounter: Payer: Self-pay | Admitting: Vascular Surgery

## 2016-05-04 ENCOUNTER — Ambulatory Visit (INDEPENDENT_AMBULATORY_CARE_PROVIDER_SITE_OTHER): Payer: Medicare HMO | Admitting: Vascular Surgery

## 2016-05-04 VITALS — BP 119/70 | HR 82 | Temp 97.9°F | Resp 16 | Ht 61.75 in | Wt 167.5 lb

## 2016-05-04 DIAGNOSIS — I8393 Asymptomatic varicose veins of bilateral lower extremities: Secondary | ICD-10-CM | POA: Diagnosis not present

## 2016-05-04 NOTE — Progress Notes (Signed)
Subjective:     Patient ID: Stacey Patterson, female   DOB: 1940-11-02, 76 y.o.   MRN: [id number]  HPI This 76 year old female was referred by Dr. Helene Kelp for evaluation of possible venous disease causing bilateral leg discomfort. Patient has a history of bilateral aching discomfort in both legs left worse than right. She describes this as if it is in the bone particularly in the anterior aspect of both lower legs and also upper legs. This does awaken her at night and is not relieved by elevation. She is not able to ambulate long distances. She does have a remote history of lumbar spine surgery proximally 20 years ago and this causes pain down her left leg at the time of the surgery. She describes this is a different type of pain. She has no history of DVT thrombophlebitis stasis ulcers or bleeding.  Past Medical History:  Diagnosis Date  . Allergy   . ANXIETY   . ARTHRITIS   . Arthritis    SHOULDERS  . Asthma    AS A CHILD  . HYPERLIPIDEMIA   . HYPERTENSION     Social History  Substance Use Topics  . Smoking status: Never Smoker  . Smokeless tobacco: Never Used  . Alcohol use No    Family History  Problem Relation Age of Onset  . CAD Father     MI in his 66s  . Diabetes Father   . Heart attack Brother     MI in his 77s  . CAD Sister   . Colon polyps Mother   . Diabetes Sister   . Diabetes Brother   . Lung cancer Sister     smoker  . Lung cancer Brother     smoker  . Breast cancer Other     niece  . Cervical cancer Other     niece  . Cancer Maternal Uncle     type unknown  . Coronary artery disease Brother     No Known Allergies   Current Outpatient Prescriptions:  .  FLUoxetine (PROZAC) 40 MG capsule, 1 tab daily, Disp: , Rfl:  .  loratadine (CLARITIN) 10 MG tablet, Take 10 mg by mouth daily., Disp: , Rfl:  .  valsartan-hydrochlorothiazide (DIOVAN-HCT) 320-12.5 MG per tablet, Take 1 tablet by mouth daily. , Disp: , Rfl:  .  guaiFENesin (MUCINEX) 600 MG 12 hr  tablet, As needed, Disp: , Rfl:  .  ranitidine (ZANTAC) 150 MG tablet, Take 1 tablet (150 mg total) by mouth at bedtime. (Patient not taking: Reported on 05/04/2016), Disp: 30 tablet, Rfl: 1 .  rosuvastatin (CRESTOR) 5 MG tablet, Take 5 mg by mouth daily., Disp: , Rfl:   Vitals:   05/04/16 1419  BP: 119/70  Pulse: 82  Resp: 16  Temp: 97.9 F (36.6 C)  TempSrc: Oral  SpO2: 95%  Weight: 167 lb 8 oz (76 kg)  Height: 5' 1.75" (1.568 m)    Body mass index is 30.88 kg/m.         Review of Systems   patient has history of hypertension, hyperlipidemia. Also remote history of lumbar spine surgery-see history of present illness Objective:   Physical Exam BP 119/70 (BP Location: Left Arm, Patient Position: Sitting, Cuff Size: Normal)   Pulse 82   Temp 97.9 F (36.6 C) (Oral)   Resp 16   Ht 5' 1.75" (1.568 m)   Wt 167 lb 8 oz (76 kg)   SpO2 95%   BMI 30.88 kg/m  Gen.-alert and oriented x3 in no apparent distress HEENT normal for age Lungs no rhonchi or wheezing Cardiovascular regular rhythm no murmurs carotid pulses 3+ palpable no bruits audible Abdomen soft nontender no palpable masses Musculoskeletal free of  major deformities Skin clear -no rashes Neurologic normal Lower extremities 3+ femoral and dorsalis pedis pulses palpable bilaterally with no edema Right leg has some spider veins in the lateral pretibial region over about a 5 cm in circumference area. No hyperpigmentation or ulceration or bulging varicosities noted Left leg has some spider veins in the medial and lateral thigh area. No edema or hyperpigmentation or bulging varicosities noted on the left.  Today I performed a bedside SonoSite ultrasound exam which revealed both great saphenous veins to be normal in size with no evidence of reflux       Assessment:     Spider veins-bilateral  Bilateral leg discomfort-unlikely due to the spider veins Remote history of lumbar spine  surgery Hypertension Hyperlipidemia    Plan:     Discussed with patient the fact that her arterial and venous systems do not appear to be the cause of her leg discomfort. It is possible that this could be related to her previous lumbar spine disease. Do not feel any further evaluation of her arterial or venous system is indicated and no intervention is indicated. Return on a when necessary basis

## 2016-05-21 ENCOUNTER — Ambulatory Visit (INDEPENDENT_AMBULATORY_CARE_PROVIDER_SITE_OTHER): Payer: Medicare HMO

## 2016-05-21 ENCOUNTER — Ambulatory Visit (INDEPENDENT_AMBULATORY_CARE_PROVIDER_SITE_OTHER): Payer: Medicare HMO | Admitting: Sports Medicine

## 2016-05-21 ENCOUNTER — Encounter: Payer: Self-pay | Admitting: Sports Medicine

## 2016-05-21 DIAGNOSIS — I739 Peripheral vascular disease, unspecified: Secondary | ICD-10-CM | POA: Diagnosis not present

## 2016-05-21 DIAGNOSIS — M722 Plantar fascial fibromatosis: Secondary | ICD-10-CM | POA: Diagnosis not present

## 2016-05-21 DIAGNOSIS — R52 Pain, unspecified: Secondary | ICD-10-CM

## 2016-05-21 MED ORDER — TRIAMCINOLONE ACETONIDE 10 MG/ML IJ SUSP: 10.0000 mg | Freq: Once | INTRAMUSCULAR | Status: DC

## 2016-05-21 NOTE — Patient Instructions (Signed)

## 2016-05-21 NOTE — Progress Notes (Signed)
   Subjective:    Patient ID: Stacey Patterson, female    DOB: Nov 30, 1940, 76 y.o.   MRN: [id number]  HPI   I have some heel pain on my left foot and it is hurting all the time and when I stand and has been going for about 3 weeks and I am not a diabetic and hurts in the arch and the ankle and there is some burning and throbbing and is sore and tender and gets numb and tingles   Review of Systems     Objective:   Physical Exam        Assessment & Plan:

## 2016-05-22 NOTE — Progress Notes (Signed)
Subjective: Stacey Patterson is a 76 y.o. female patient presents to office with complaint of heel pain on the left. Patient admits to post static dyskinesia for 3 weeks in duration that feels like a bruise to the heel that extends sometimes to back of heel and to arch that's worse with walking. Patient has treated this problem with stretching, icing, padding, tylenol with no relief. Denies any other pedal complaints.   Patient Active Problem List   Diagnosis Date Noted  . Spider veins of both lower extremities 05/04/2016  . Chest pain 12/07/2012  . ANXIETY 10/07/2008  . ARTHRITIS 10/07/2008  . HYPERLIPIDEMIA 10/01/2008  . HYPERTENSION 10/01/2008  . ANAL OR RECTAL PAIN 10/01/2008  . DIARRHEA 10/01/2008    Current Outpatient Prescriptions on File Prior to Visit  Medication Sig Dispense Refill  . FLUoxetine (PROZAC) 40 MG capsule 1 tab daily    . guaiFENesin (MUCINEX) 600 MG 12 hr tablet As needed    . loratadine (CLARITIN) 10 MG tablet Take 10 mg by mouth daily.    . ranitidine (ZANTAC) 150 MG tablet Take 1 tablet (150 mg total) by mouth at bedtime. (Patient not taking: Reported on 05/04/2016) 30 tablet 1  . rosuvastatin (CRESTOR) 5 MG tablet Take 5 mg by mouth daily.    . valsartan-hydrochlorothiazide (DIOVAN-HCT) 320-12.5 MG per tablet Take 1 tablet by mouth daily.      No current facility-administered medications on file prior to visit.     No Known Allergies  Objective: Physical Exam General: The patient is alert and oriented x3 in no acute distress.  Dermatology: Skin is warm, dry and supple bilateral lower extremities. Nails 1-10 are normal. There is no erythema, edema, no eccymosis, no open lesions present. Integument is otherwise unremarkable.  Vascular: Dorsalis Pedis pulse and Posterior Tibial pulse are 1/4 bilateral. Capillary fill time is <3 secs with varicosities bilateral.  Neurological: Grossly intact to light touch with an achilles reflex of +2/5 and a negative  Tinel's sign bilateral.  Musculoskeletal: Tenderness to palpation at the medial calcaneal tubercale and through the insertion of the plantar fascia on the left foot. + fat pad atrophy bilateral. No pain with compression of calcaneus bilateral. No pain with tuning fork to calcaneus bilateral. No pain with calf compression bilateral. There is decreased Ankle joint range of motion bilateral. All other joints range of motion within normal limits bilateral. Strength 5/5 in all groups bilateral. Pes cavus bilateral.   Gait: Unassisted, Antalgic avoid weight on left heel  Xray, Left foot:  Normal osseous mineralization. Joint spaces preserved except at midfoot where there is mild bone spurs a early arthritis. Supinated foot supportive of pes cavus. No fracture/dislocation/boney destruction. Calcaneal spur present with mild thickening of plantar fascia. No other soft tissue abnormalities or radiopaque foreign bodies.   Assessment and Plan: Problem List Items Addressed This Visit    None    Visit Diagnoses    Plantar fasciitis of left foot    -  Primary   Relevant Medications   triamcinolone acetonide (KENALOG) 10 MG/ML injection 10 mg   Pain       Relevant Orders   DG Foot Complete Left   PVD (peripheral vascular disease) ([place])          -Complete examination performed.  -Xrays reviewed -Discussed with patient in detail the condition of plantar fasciitis, how this occurs and general treatment options. Explained both conservative and surgical treatments.  -After oral consent and aseptic prep, injected a mixture containing  1 ml of 2% plain lidocaine, 1 ml 0.5% plain marcaine, 0.5 ml of kenalog 10 and 0.5 ml of dexamethasone phosphate into left heel. Post-injection care discussed with patient.  -Recommended good supportive shoes and advised use of OTC insert. Explained to patient that if these orthoses work well, we will continue with these. If these do not improve her condition and  pain, we will  consider custom molded orthoses. -Will add on Fascial brace next visit if continues to be bothersome. -Explained and dispensed to patient daily stretching exercises. -Recommend patient to ice affected area 1-2x daily. -Patient to return to office in 3-4 weeks for follow up or sooner if problems or questions arise.  Landis Martins, DPM

## 2016-05-25 DIAGNOSIS — M5442 Lumbago with sciatica, left side: Secondary | ICD-10-CM | POA: Diagnosis not present

## 2016-05-25 DIAGNOSIS — G8929 Other chronic pain: Secondary | ICD-10-CM | POA: Diagnosis not present

## 2016-06-01 DIAGNOSIS — Z01812 Encounter for preprocedural laboratory examination: Secondary | ICD-10-CM | POA: Diagnosis not present

## 2016-06-04 DIAGNOSIS — G8929 Other chronic pain: Secondary | ICD-10-CM | POA: Diagnosis not present

## 2016-06-04 DIAGNOSIS — M5442 Lumbago with sciatica, left side: Secondary | ICD-10-CM | POA: Diagnosis not present

## 2016-06-10 ENCOUNTER — Ambulatory Visit (INDEPENDENT_AMBULATORY_CARE_PROVIDER_SITE_OTHER): Payer: Medicare HMO | Admitting: Sports Medicine

## 2016-06-10 ENCOUNTER — Ambulatory Visit: Payer: Medicare HMO | Admitting: Sports Medicine

## 2016-06-10 DIAGNOSIS — R52 Pain, unspecified: Secondary | ICD-10-CM

## 2016-06-10 DIAGNOSIS — M722 Plantar fascial fibromatosis: Secondary | ICD-10-CM | POA: Diagnosis not present

## 2016-06-10 DIAGNOSIS — I739 Peripheral vascular disease, unspecified: Secondary | ICD-10-CM

## 2016-06-10 MED ORDER — METHYLPREDNISOLONE 4 MG PO TBPK: ORAL_TABLET | ORAL | 0 refills | Status: DC

## 2016-06-10 MED ORDER — TRIAMCINOLONE ACETONIDE 10 MG/ML IJ SUSP: 10.0000 mg | Freq: Once | INTRAMUSCULAR | Status: DC

## 2016-06-10 NOTE — Progress Notes (Signed)
Subjective: Stacey Patterson is a 76 y.o. female returns to office for follow up evaluation after Left/ heel injection for plantar fasciitis, injection #1 administered 3 weeks ago. Patient states that the injection seems to help her pain; for 1 week, however, slowly start to come back and now feels like the pain is worse with pain at the front of her shin as well. Patient denies any recent changes in medications or new problems since last visit.   Patient Active Problem List   Diagnosis Date Noted  . Spider veins of both lower extremities 05/04/2016  . Chest pain 12/07/2012  . ANXIETY 10/07/2008  . ARTHRITIS 10/07/2008  . HYPERLIPIDEMIA 10/01/2008  . HYPERTENSION 10/01/2008  . ANAL OR RECTAL PAIN 10/01/2008  . DIARRHEA 10/01/2008    Current Outpatient Prescriptions on File Prior to Visit  Medication Sig Dispense Refill  . FLUoxetine (PROZAC) 40 MG capsule 1 tab daily    . guaiFENesin (MUCINEX) 600 MG 12 hr tablet As needed    . loratadine (CLARITIN) 10 MG tablet Take 10 mg by mouth daily.    . ranitidine (ZANTAC) 150 MG tablet Take 1 tablet (150 mg total) by mouth at bedtime. (Patient not taking: Reported on 05/04/2016) 30 tablet 1  . rosuvastatin (CRESTOR) 5 MG tablet Take 5 mg by mouth daily.    . valsartan-hydrochlorothiazide (DIOVAN-HCT) 320-12.5 MG per tablet Take 1 tablet by mouth daily.      Current Facility-Administered Medications on File Prior to Visit  Medication Dose Route Frequency Provider Last Rate Last Dose  . triamcinolone acetonide (KENALOG) 10 MG/ML injection 10 mg  10 mg Other Once Landis Martins, DPM        No Known Allergies  Objective:   General:  Alert and oriented x 3, in no acute distress  Dermatology: Skin is warm, dry and supple bilateral lower extremities. Nails 1-10 are normal. There is no erythema, edema, no eccymosis, no open lesions present. Integument is otherwise unremarkable.  Vascular: Dorsalis Pedis pulse and Posterior Tibial pulse are 1/4  bilateral. Capillary fill time is <3 secs with varicosities bilateral.  Neurological: Grossly intact to light touch with an achilles reflex of +2/5 and a negative Tinel's sign bilateral.  Musculoskeletal: Tenderness to palpation at the medial calcaneal tubercale and through the insertion of the plantar fascia on the left foot. + fat pad atrophy bilateral. No pain with compression of calcaneus bilateral. No pain with tuning fork to calcaneus bilateral. No pain with calf compression bilateral. There is decreased Ankle joint range of motion bilateral. With likely shin splints on the left anterior shin from a tight calf muscle. All other joints range of motion within normal limits bilateral. Strength 5/5 in all groups bilateral. Pes cavus bilateral.   Assessment and Plan: Problem List Items Addressed This Visit    None    Visit Diagnoses    Plantar fasciitis of left foot    -  Primary   Relevant Medications   triamcinolone acetonide (KENALOG) 10 MG/ML injection 10 mg   methylPREDNISolone (MEDROL DOSEPAK) 4 MG TBPK tablet   Pain       PVD (peripheral vascular disease) ([facility])          -Complete examination performed.  -Previous x-rays reviewed. -Discussed with patient in detail the condition of plantar fasciitis, how this  occurs related to the foot type of the patient and general treatment options. - Patient opted for another injection today; After oral consent and aseptic prep, injected a mixture containing 1  ml of 1%plain lidocaine, 1 ml 0.5% plain marcaine, 0.5 ml of kenalog 10 and 0.5 ml of dexmethasone phosphate to left heel at area of most pain/trigger point injection. -Dispensed Left fascial brace -Prescribed Medrol Dosepak to take as instructed -Continue with stretching, icing, good supportive shoes, inserts daily.  -Discussed long term care and reocurrence; will closely monitor; if fails to improve will consider other treatment modalities.  -Patient to return to office in 3-4 weeks  for follow up or sooner if problems or questions arise.  Landis Martins, DPM

## 2016-06-15 DIAGNOSIS — M5442 Lumbago with sciatica, left side: Secondary | ICD-10-CM | POA: Diagnosis not present

## 2016-06-15 DIAGNOSIS — G8929 Other chronic pain: Secondary | ICD-10-CM | POA: Diagnosis not present

## 2016-06-25 ENCOUNTER — Other Ambulatory Visit: Payer: Self-pay | Admitting: Sports Medicine

## 2016-06-25 DIAGNOSIS — M722 Plantar fascial fibromatosis: Secondary | ICD-10-CM

## 2016-06-25 MED ORDER — METHYLPREDNISOLONE 4 MG PO TBPK: ORAL_TABLET | ORAL | 0 refills | Status: DC

## 2016-06-25 NOTE — Progress Notes (Signed)
Refilled Medrol dosepak -Dr. Chauncey Cruel

## 2016-07-09 ENCOUNTER — Ambulatory Visit (INDEPENDENT_AMBULATORY_CARE_PROVIDER_SITE_OTHER): Payer: Medicare HMO | Admitting: Sports Medicine

## 2016-07-09 DIAGNOSIS — M722 Plantar fascial fibromatosis: Secondary | ICD-10-CM | POA: Diagnosis not present

## 2016-07-09 DIAGNOSIS — I739 Peripheral vascular disease, unspecified: Secondary | ICD-10-CM

## 2016-07-09 DIAGNOSIS — R52 Pain, unspecified: Secondary | ICD-10-CM | POA: Diagnosis not present

## 2016-07-09 MED ORDER — METHYLPREDNISOLONE 4 MG PO TBPK: ORAL_TABLET | ORAL | 0 refills | Status: DC

## 2016-07-09 NOTE — Progress Notes (Signed)
Subjective: GREG ECKRICH is a 76 y.o. female returns to office for follow up evaluation after Left heel injection for plantar fasciitis, injection #2 administered 4 weeks ago. Patient states that the injection seems to help her pain for 1 week and then when she got the refill on the steroid thought it was gone but its NO BETTER. Denies any other pedal complaints.   Patient Active Problem List   Diagnosis Date Noted  . Spider veins of both lower extremities 05/04/2016  . Chest pain 12/07/2012  . ANXIETY 10/07/2008  . ARTHRITIS 10/07/2008  . HYPERLIPIDEMIA 10/01/2008  . HYPERTENSION 10/01/2008  . ANAL OR RECTAL PAIN 10/01/2008  . DIARRHEA 10/01/2008    Current Outpatient Prescriptions on File Prior to Visit  Medication Sig Dispense Refill  . FLUoxetine (PROZAC) 40 MG capsule 1 tab daily    . guaiFENesin (MUCINEX) 600 MG 12 hr tablet As needed    . loratadine (CLARITIN) 10 MG tablet Take 10 mg by mouth daily.    . ranitidine (ZANTAC) 150 MG tablet Take 1 tablet (150 mg total) by mouth at bedtime. (Patient not taking: Reported on 05/04/2016) 30 tablet 1  . rosuvastatin (CRESTOR) 5 MG tablet Take 5 mg by mouth daily.    . valsartan-hydrochlorothiazide (DIOVAN-HCT) 320-12.5 MG per tablet Take 1 tablet by mouth daily.      Current Facility-Administered Medications on File Prior to Visit  Medication Dose Route Frequency Provider Last Rate Last Dose  . triamcinolone acetonide (KENALOG) 10 MG/ML injection 10 mg  10 mg Other Once Landis Martins, DPM      . triamcinolone acetonide (KENALOG) 10 MG/ML injection 10 mg  10 mg Other Once Landis Martins, DPM        No Known Allergies  Objective:   General:  Alert and oriented x 3, in no acute distress  Dermatology: Skin is warm, dry and supple bilateral lower extremities. Nails 1-10 are normal. There is no erythema, edema, no eccymosis, no open lesions present. Integument is otherwise unremarkable.  Vascular: Dorsalis Pedis pulse and  Posterior Tibial pulse are 1/4 bilateral. Capillary fill time is <3 secs with varicosities bilateral.  Neurological: Grossly intact to light touch with an achilles reflex of +2/5 and a negative Tinel's sign bilateral.  Musculoskeletal: Tenderness to palpation at the medial calcaneal tubercale and through the insertion of the plantar fascia on the left foot. + fat pad atrophy bilateral. No pain with compression of calcaneus bilateral. No pain with tuning fork to calcaneus bilateral. No pain with calf compression bilateral. There is decreased Ankle joint range of motion bilateral. All other joints range of motion within normal limits bilateral. Strength 5/5 in all groups bilateral. Pes cavus bilateral.   Assessment and Plan: Problem List Items Addressed This Visit    None    Visit Diagnoses    Plantar fasciitis of left foot    -  Primary   Relevant Medications   methylPREDNISolone (MEDROL DOSEPAK) 4 MG TBPK tablet   Pain       PVD (peripheral vascular disease) ([facility])          -Complete examination performed.  -Previous x-rays reviewed. -Discussed with patient in detail the condition of plantar fasciitis, how this  occurs related to the foot type of the patient and general treatment options. -Dispensed CAM boot to wear at all times except for swim class -Continue with fascial brace when walking to pool for swim class -Prescribed Medrol Dosepak to take as instructed -Rx Night splint to  use at bedtime as instructed  -Continue with stretching and icing daily.  -Discussed long term care and reocurrence; will closely monitor; if fails to improve will consider other treatment modalities.  -Patient to return to office in 2 weeks for follow up or sooner if problems or questions arise.  Landis Martins, DPM

## 2016-07-13 DIAGNOSIS — M5442 Lumbago with sciatica, left side: Secondary | ICD-10-CM | POA: Diagnosis not present

## 2016-07-29 ENCOUNTER — Ambulatory Visit (INDEPENDENT_AMBULATORY_CARE_PROVIDER_SITE_OTHER): Payer: Medicare HMO | Admitting: Sports Medicine

## 2016-07-29 DIAGNOSIS — M722 Plantar fascial fibromatosis: Secondary | ICD-10-CM | POA: Diagnosis not present

## 2016-07-29 DIAGNOSIS — I739 Peripheral vascular disease, unspecified: Secondary | ICD-10-CM

## 2016-07-29 DIAGNOSIS — R52 Pain, unspecified: Secondary | ICD-10-CM

## 2016-07-29 MED ORDER — TRIAMCINOLONE ACETONIDE 10 MG/ML IJ SUSP: 10.0000 mg | Freq: Once | INTRAMUSCULAR | Status: DC

## 2016-07-29 NOTE — Patient Instructions (Signed)

## 2016-07-29 NOTE — Progress Notes (Signed)
Subjective: Stacey Patterson is a 76 y.o. female returns to office for follow up evaluation after Left heel injection for plantar fasciitis, injection #2 administered 7 weeks ago. Patient states pain is a little better now 7/10. Night splint helps. Cant tell much difference with boot but when goes without it can tell. Patient is in water therapy for her back. Denies any other pedal complaints.   Patient Active Problem List   Diagnosis Date Noted  . Spider veins of both lower extremities 05/04/2016  . Chest pain 12/07/2012  . ANXIETY 10/07/2008  . ARTHRITIS 10/07/2008  . HYPERLIPIDEMIA 10/01/2008  . HYPERTENSION 10/01/2008  . ANAL OR RECTAL PAIN 10/01/2008  . DIARRHEA 10/01/2008    Current Outpatient Prescriptions on File Prior to Visit  Medication Sig Dispense Refill  . FLUoxetine (PROZAC) 40 MG capsule 1 tab daily    . guaiFENesin (MUCINEX) 600 MG 12 hr tablet As needed    . loratadine (CLARITIN) 10 MG tablet Take 10 mg by mouth daily.    . methylPREDNISolone (MEDROL DOSEPAK) 4 MG TBPK tablet Take as instructed 21 tablet 0  . ranitidine (ZANTAC) 150 MG tablet Take 1 tablet (150 mg total) by mouth at bedtime. (Patient not taking: Reported on 05/04/2016) 30 tablet 1  . rosuvastatin (CRESTOR) 5 MG tablet Take 5 mg by mouth daily.    . valsartan-hydrochlorothiazide (DIOVAN-HCT) 320-12.5 MG per tablet Take 1 tablet by mouth daily.      Current Facility-Administered Medications on File Prior to Visit  Medication Dose Route Frequency Provider Last Rate Last Dose  . triamcinolone acetonide (KENALOG) 10 MG/ML injection 10 mg  10 mg Other Once Landis Martins, DPM      . triamcinolone acetonide (KENALOG) 10 MG/ML injection 10 mg  10 mg Other Once Landis Martins, DPM        No Known Allergies  Objective:   General:  Alert and oriented x 3, in no acute distress  Dermatology: Skin is warm, dry and supple bilateral lower extremities. Nails 1-10 are normal. There is no erythema, edema, no  eccymosis, no open lesions present. Integument is otherwise unremarkable.  Vascular: Dorsalis Pedis pulse and Posterior Tibial pulse are 1/4 bilateral. Capillary fill time is <3 secs with varicosities bilateral.  Neurological: Grossly intact to light touch with an achilles reflex of +2/5 and a negative Tinel's sign bilateral.  Musculoskeletal: Tenderness to palpation at the medial calcaneal tubercale and through the insertion of the plantar fascia on the left foot. + fat pad atrophy bilateral. No pain with compression of calcaneus bilateral. No pain with tuning fork to calcaneus bilateral. No pain with calf compression bilateral. There is decreased Ankle joint range of motion bilateral. All other joints range of motion within normal limits bilateral. Strength 5/5 in all groups bilateral. Pes cavus bilateral.   Assessment and Plan: Problem List Items Addressed This Visit    None    Visit Diagnoses    Plantar fasciitis of left foot    -  Primary   Relevant Medications   triamcinolone acetonide (KENALOG) 10 MG/ML injection 10 mg (Start on 07/29/2016  4:15 PM)   Pain       PVD (peripheral vascular disease) ([facility])          -Complete examination performed.  -Previous x-rays reviewed. -Discussed with patient in detail the condition of plantar fasciitis, how this  occurs related to the foot type of the patient and general treatment options. -After oral consent and aseptic prep, injected a mixture containing  1 ml of 2%  plain lidocaine, 1 ml 0.5% plain marcaine, 0.5 ml of kenalog 10 and 0.5 ml of dexamethasone phosphate into left heel at fascia without complication. Post-injection care discussed with patient.  This is injection #3 to area. -Applied fascial taping and then after 5 days to return to fascial brace -Continue with CAM boot for flare ups as needed  -Continue with Night splint to use at bedtime as instructed  -Continue with stretching and icing daily.  -Rx [facility] at [facility] [place]   -Discussed long term care and reocurrence; will closely monitor; if fails to improve will consider other treatment modalities.  -Patient to return to office in 3 weeks for follow up or sooner if problems or questions arise.  Landis Martins, DPM

## 2016-08-02 DIAGNOSIS — M25572 Pain in left ankle and joints of left foot: Secondary | ICD-10-CM | POA: Diagnosis not present

## 2016-08-02 DIAGNOSIS — M25675 Stiffness of left foot, not elsewhere classified: Secondary | ICD-10-CM | POA: Diagnosis not present

## 2016-08-02 DIAGNOSIS — M6281 Muscle weakness (generalized): Secondary | ICD-10-CM | POA: Diagnosis not present

## 2016-08-02 DIAGNOSIS — R262 Difficulty in walking, not elsewhere classified: Secondary | ICD-10-CM | POA: Diagnosis not present

## 2016-08-04 DIAGNOSIS — M25572 Pain in left ankle and joints of left foot: Secondary | ICD-10-CM | POA: Diagnosis not present

## 2016-08-04 DIAGNOSIS — R262 Difficulty in walking, not elsewhere classified: Secondary | ICD-10-CM | POA: Diagnosis not present

## 2016-08-04 DIAGNOSIS — M6281 Muscle weakness (generalized): Secondary | ICD-10-CM | POA: Diagnosis not present

## 2016-08-04 DIAGNOSIS — M25675 Stiffness of left foot, not elsewhere classified: Secondary | ICD-10-CM | POA: Diagnosis not present

## 2016-08-09 DIAGNOSIS — R262 Difficulty in walking, not elsewhere classified: Secondary | ICD-10-CM | POA: Diagnosis not present

## 2016-08-09 DIAGNOSIS — M25675 Stiffness of left foot, not elsewhere classified: Secondary | ICD-10-CM | POA: Diagnosis not present

## 2016-08-09 DIAGNOSIS — M6281 Muscle weakness (generalized): Secondary | ICD-10-CM | POA: Diagnosis not present

## 2016-08-09 DIAGNOSIS — M25572 Pain in left ankle and joints of left foot: Secondary | ICD-10-CM | POA: Diagnosis not present

## 2016-08-13 DIAGNOSIS — M25675 Stiffness of left foot, not elsewhere classified: Secondary | ICD-10-CM | POA: Diagnosis not present

## 2016-08-13 DIAGNOSIS — M25572 Pain in left ankle and joints of left foot: Secondary | ICD-10-CM | POA: Diagnosis not present

## 2016-08-13 DIAGNOSIS — R262 Difficulty in walking, not elsewhere classified: Secondary | ICD-10-CM | POA: Diagnosis not present

## 2016-08-13 DIAGNOSIS — M6281 Muscle weakness (generalized): Secondary | ICD-10-CM | POA: Diagnosis not present

## 2016-08-16 DIAGNOSIS — R262 Difficulty in walking, not elsewhere classified: Secondary | ICD-10-CM | POA: Diagnosis not present

## 2016-08-16 DIAGNOSIS — M6281 Muscle weakness (generalized): Secondary | ICD-10-CM | POA: Diagnosis not present

## 2016-08-16 DIAGNOSIS — M25675 Stiffness of left foot, not elsewhere classified: Secondary | ICD-10-CM | POA: Diagnosis not present

## 2016-08-16 DIAGNOSIS — M25572 Pain in left ankle and joints of left foot: Secondary | ICD-10-CM | POA: Diagnosis not present

## 2016-08-17 DIAGNOSIS — M5442 Lumbago with sciatica, left side: Secondary | ICD-10-CM | POA: Diagnosis not present

## 2016-08-17 DIAGNOSIS — G8929 Other chronic pain: Secondary | ICD-10-CM | POA: Diagnosis not present

## 2016-08-20 ENCOUNTER — Ambulatory Visit (INDEPENDENT_AMBULATORY_CARE_PROVIDER_SITE_OTHER): Payer: Medicare HMO | Admitting: Sports Medicine

## 2016-08-20 ENCOUNTER — Encounter (INDEPENDENT_AMBULATORY_CARE_PROVIDER_SITE_OTHER): Payer: Self-pay

## 2016-08-20 ENCOUNTER — Encounter: Payer: Self-pay | Admitting: Sports Medicine

## 2016-08-20 DIAGNOSIS — R52 Pain, unspecified: Secondary | ICD-10-CM | POA: Diagnosis not present

## 2016-08-20 DIAGNOSIS — M6281 Muscle weakness (generalized): Secondary | ICD-10-CM | POA: Diagnosis not present

## 2016-08-20 DIAGNOSIS — M25675 Stiffness of left foot, not elsewhere classified: Secondary | ICD-10-CM | POA: Diagnosis not present

## 2016-08-20 DIAGNOSIS — I739 Peripheral vascular disease, unspecified: Secondary | ICD-10-CM | POA: Diagnosis not present

## 2016-08-20 DIAGNOSIS — R262 Difficulty in walking, not elsewhere classified: Secondary | ICD-10-CM | POA: Diagnosis not present

## 2016-08-20 DIAGNOSIS — M722 Plantar fascial fibromatosis: Secondary | ICD-10-CM | POA: Diagnosis not present

## 2016-08-20 DIAGNOSIS — M25572 Pain in left ankle and joints of left foot: Secondary | ICD-10-CM | POA: Diagnosis not present

## 2016-08-20 MED ORDER — METHYLPREDNISOLONE 4 MG PO TBPK: ORAL_TABLET | ORAL | 0 refills | Status: DC

## 2016-08-20 NOTE — Progress Notes (Signed)
Subjective: Stacey Patterson is a 76 y.o. female returns to office for follow up evaluation after Left heel injection for plantar fasciitis, injection #3 administered 3 weeks ago. Patient states pain is a little better; PT helps. Shot only lasted for a few days. Reports that taping helped the most. States pain now is with activity. Denies any other pedal complaints.   Patient Active Problem List   Diagnosis Date Noted  . Spider veins of both lower extremities 05/04/2016  . Chest pain 12/07/2012  . ANXIETY 10/07/2008  . ARTHRITIS 10/07/2008  . HYPERLIPIDEMIA 10/01/2008  . HYPERTENSION 10/01/2008  . ANAL OR RECTAL PAIN 10/01/2008  . DIARRHEA 10/01/2008    Current Outpatient Prescriptions on File Prior to Visit  Medication Sig Dispense Refill  . FLUoxetine (PROZAC) 40 MG capsule 1 tab daily    . guaiFENesin (MUCINEX) 600 MG 12 hr tablet As needed    . loratadine (CLARITIN) 10 MG tablet Take 10 mg by mouth daily.    . ranitidine (ZANTAC) 150 MG tablet Take 1 tablet (150 mg total) by mouth at bedtime. (Patient not taking: Reported on 05/04/2016) 30 tablet 1  . rosuvastatin (CRESTOR) 5 MG tablet Take 5 mg by mouth daily.    . valsartan-hydrochlorothiazide (DIOVAN-HCT) 320-12.5 MG per tablet Take 1 tablet by mouth daily.      Current Facility-Administered Medications on File Prior to Visit  Medication Dose Route Frequency Provider Last Rate Last Dose  . triamcinolone acetonide (KENALOG) 10 MG/ML injection 10 mg  10 mg Other Once Landis Martins, DPM      . triamcinolone acetonide (KENALOG) 10 MG/ML injection 10 mg  10 mg Other Once Landis Martins, DPM      . triamcinolone acetonide (KENALOG) 10 MG/ML injection 10 mg  10 mg Other Once Landis Martins, DPM        Allergies  Allergen Reactions  . Latex   . Naproxen   . Tape     Objective:   General:  Alert and oriented x 3, in no acute distress  Dermatology: Skin is warm, dry and supple bilateral lower extremities. Nails 1-10 are  normal. There is no erythema, edema, no eccymosis, no open lesions present. Integument is otherwise unremarkable.  Vascular: Dorsalis Pedis pulse and Posterior Tibial pulse are 1/4 bilateral. Capillary fill time is <3 secs with varicosities bilateral.  Neurological: Grossly intact to light touch with an achilles reflex of +2/5 and a negative Tinel's sign bilateral.  Musculoskeletal: Tenderness to palpation at the medial calcaneal tubercale and through the insertion of the plantar fascia on the left foot. Mild tenderness midarch at plantar fascia on right, + fat pad atrophy bilateral. There is tenderness to anterior shin and anterior ankle on left; had PT earlier today with sensitivity. No pain with compression of calcaneus bilateral. No pain with tuning fork to calcaneus bilateral. No pain with calf compression bilateral. There is decreased Ankle joint range of motion bilateral. All other joints range of motion within normal limits bilateral. Strength 5/5 in all groups bilateral. Pes cavus bilateral.   Assessment and Plan: Problem List Items Addressed This Visit    None    Visit Diagnoses    Plantar fasciitis of left foot    -  Primary   Relevant Medications   methylPREDNISolone (MEDROL DOSEPAK) 4 MG TBPK tablet   Pain       PVD (peripheral vascular disease) (HCC)          -Complete examination performed.  -Previous x-rays reviewed. -Re-Discussed  with patient in detail the condition of plantar fasciitis, how this  occurs related to the foot type of the patient and general treatment options. -Refill Medrol dosepak. -Applied fascial taping and then after 5 days to return to fascial brace on left -Continue with Night splint to use at bedtime as instructed  -Continue with stretching and icing daily.  -[facility] [place]; Recommend dry needling and continue 1x per wk for 4 wks  -Discussed long term care and reocurrence; will closely monitor; if fails to improve will  consider other treatment modalities.  -Patient to return to office in 4 weeks for follow up or sooner if problems or questions arise.  Landis Martins, DPM

## 2016-08-25 DIAGNOSIS — M25572 Pain in left ankle and joints of left foot: Secondary | ICD-10-CM | POA: Diagnosis not present

## 2016-08-25 DIAGNOSIS — M25675 Stiffness of left foot, not elsewhere classified: Secondary | ICD-10-CM | POA: Diagnosis not present

## 2016-08-25 DIAGNOSIS — R262 Difficulty in walking, not elsewhere classified: Secondary | ICD-10-CM | POA: Diagnosis not present

## 2016-08-25 DIAGNOSIS — M6281 Muscle weakness (generalized): Secondary | ICD-10-CM | POA: Diagnosis not present

## 2016-09-01 DIAGNOSIS — M6281 Muscle weakness (generalized): Secondary | ICD-10-CM | POA: Diagnosis not present

## 2016-09-01 DIAGNOSIS — M25675 Stiffness of left foot, not elsewhere classified: Secondary | ICD-10-CM | POA: Diagnosis not present

## 2016-09-01 DIAGNOSIS — M25572 Pain in left ankle and joints of left foot: Secondary | ICD-10-CM | POA: Diagnosis not present

## 2016-09-01 DIAGNOSIS — R262 Difficulty in walking, not elsewhere classified: Secondary | ICD-10-CM | POA: Diagnosis not present

## 2016-09-08 DIAGNOSIS — M25572 Pain in left ankle and joints of left foot: Secondary | ICD-10-CM | POA: Diagnosis not present

## 2016-09-08 DIAGNOSIS — M6281 Muscle weakness (generalized): Secondary | ICD-10-CM | POA: Diagnosis not present

## 2016-09-08 DIAGNOSIS — R262 Difficulty in walking, not elsewhere classified: Secondary | ICD-10-CM | POA: Diagnosis not present

## 2016-09-08 DIAGNOSIS — M25675 Stiffness of left foot, not elsewhere classified: Secondary | ICD-10-CM | POA: Diagnosis not present

## 2016-09-13 DIAGNOSIS — M6281 Muscle weakness (generalized): Secondary | ICD-10-CM | POA: Diagnosis not present

## 2016-09-13 DIAGNOSIS — M25572 Pain in left ankle and joints of left foot: Secondary | ICD-10-CM | POA: Diagnosis not present

## 2016-09-13 DIAGNOSIS — M25675 Stiffness of left foot, not elsewhere classified: Secondary | ICD-10-CM | POA: Diagnosis not present

## 2016-09-13 DIAGNOSIS — R262 Difficulty in walking, not elsewhere classified: Secondary | ICD-10-CM | POA: Diagnosis not present

## 2016-09-14 DIAGNOSIS — M5442 Lumbago with sciatica, left side: Secondary | ICD-10-CM | POA: Diagnosis not present

## 2016-09-14 DIAGNOSIS — N390 Urinary tract infection, site not specified: Secondary | ICD-10-CM | POA: Diagnosis not present

## 2016-09-14 DIAGNOSIS — G8929 Other chronic pain: Secondary | ICD-10-CM | POA: Diagnosis not present

## 2016-09-14 DIAGNOSIS — Z6831 Body mass index (BMI) 31.0-31.9, adult: Secondary | ICD-10-CM | POA: Diagnosis not present

## 2016-09-14 DIAGNOSIS — J302 Other seasonal allergic rhinitis: Secondary | ICD-10-CM | POA: Diagnosis not present

## 2016-09-14 DIAGNOSIS — M48061 Spinal stenosis, lumbar region without neurogenic claudication: Secondary | ICD-10-CM | POA: Diagnosis not present

## 2016-09-14 DIAGNOSIS — J329 Chronic sinusitis, unspecified: Secondary | ICD-10-CM | POA: Diagnosis not present

## 2016-09-17 ENCOUNTER — Ambulatory Visit (INDEPENDENT_AMBULATORY_CARE_PROVIDER_SITE_OTHER): Payer: Medicare HMO | Admitting: Sports Medicine

## 2016-09-17 ENCOUNTER — Encounter (INDEPENDENT_AMBULATORY_CARE_PROVIDER_SITE_OTHER): Payer: Self-pay

## 2016-09-17 DIAGNOSIS — I739 Peripheral vascular disease, unspecified: Secondary | ICD-10-CM

## 2016-09-17 DIAGNOSIS — M722 Plantar fascial fibromatosis: Secondary | ICD-10-CM

## 2016-09-17 DIAGNOSIS — M79672 Pain in left foot: Secondary | ICD-10-CM

## 2016-09-17 DIAGNOSIS — M79671 Pain in right foot: Secondary | ICD-10-CM

## 2016-09-17 NOTE — Patient Instructions (Signed)

## 2016-09-17 NOTE — Progress Notes (Signed)
Subjective: Stacey Patterson is a 76 y.o. female returns to office for follow up evaluation on Left heel for plantar fasciitis, patient reports that she is feeling better. Pain is now down to 3 out of 10. States that therapy is helping and taping helps most. States that occasionally she will get a little pain in the arch on the right. However, nothing as significant as her left. Denies any other pedal complaints.   Patient Active Problem List   Diagnosis Date Noted  . Spider veins of both lower extremities 05/04/2016  . Chest pain 12/07/2012  . ANXIETY 10/07/2008  . ARTHRITIS 10/07/2008  . HYPERLIPIDEMIA 10/01/2008  . HYPERTENSION 10/01/2008  . ANAL OR RECTAL PAIN 10/01/2008  . DIARRHEA 10/01/2008    Current Outpatient Prescriptions on File Prior to Visit  Medication Sig Dispense Refill  . FLUoxetine (PROZAC) 40 MG capsule 1 tab daily    . guaiFENesin (MUCINEX) 600 MG 12 hr tablet As needed    . loratadine (CLARITIN) 10 MG tablet Take 10 mg by mouth daily.    . methylPREDNISolone (MEDROL DOSEPAK) 4 MG TBPK tablet Take as instructed 21 tablet 0  . ranitidine (ZANTAC) 150 MG tablet Take 1 tablet (150 mg total) by mouth at bedtime. (Patient not taking: Reported on 05/04/2016) 30 tablet 1  . rosuvastatin (CRESTOR) 5 MG tablet Take 5 mg by mouth daily.    . valsartan-hydrochlorothiazide (DIOVAN-HCT) 320-12.5 MG per tablet Take 1 tablet by mouth daily.      Current Facility-Administered Medications on File Prior to Visit  Medication Dose Route Frequency Provider Last Rate Last Dose  . triamcinolone acetonide (KENALOG) 10 MG/ML injection 10 mg  10 mg Other Once Landis Martins, DPM      . triamcinolone acetonide (KENALOG) 10 MG/ML injection 10 mg  10 mg Other Once Landis Martins, DPM      . triamcinolone acetonide (KENALOG) 10 MG/ML injection 10 mg  10 mg Other Once Landis Martins, DPM        Allergies  Allergen Reactions  . Latex   . Naproxen   . Tape     Objective:   General:   Alert and oriented x 3, in no acute distress  Dermatology: Skin is warm, dry and supple bilateral lower extremities. Nails 1-10 are normal. There is no erythema, edema, no eccymosis, no open lesions present. Integument is otherwise unremarkable.  Vascular: Dorsalis Pedis pulse and Posterior Tibial pulse are 1/4 bilateral. Capillary fill time is <3 secs with varicosities bilateral.  Neurological: Grossly intact to light touch with an achilles reflex of +2/5 and a negative Tinel's sign bilateral.  Musculoskeletal: Tenderness to palpation at the medial calcaneal tubercale and through the insertion of the plantar fascia on the left foot. Mild tenderness midarch at plantar fascia on right, + fat pad atrophy bilateral. There is no tenderness to anterior shin and anterior ankle on left. No pain with compression of calcaneus bilateral. No pain with tuning fork to calcaneus bilateral. No pain with calf compression bilateral. There is decreased Ankle joint range of motion bilateral. All other joints range of motion within normal limits bilateral. Strength 5/5 in all groups bilateral. Pes cavus bilateral.   Assessment and Plan: Problem List Items Addressed This Visit    None    Visit Diagnoses    Plantar fasciitis of left foot    -  Primary   Plantar fasciitis of right foot       Foot pain, bilateral  PVD (peripheral vascular disease) ([place])          -Complete examination performed.  -Re-Discussed with patient in detail the condition of plantar fasciitis, how this  occurs related to the foot type of the patient and general treatment options. -Applied fascial taping Bilateral and then after 5 days to return to fascial brace on left -Continue with Night splint to use at bedtime as instructed  -Continue with stretching and icing daily.  -[facility] with [facility] at [facility] [place] -Discussed long term care and reocurrence; will closely monitor; if fails to improve will consider other treatment  modalities. Advised patient if fails to continue to improve, then we will recommend EPAT -Patient to return to office in 4-6 weeks for follow up or sooner if problems or questions arise. If patient's pain is resolved, then will encouraged patient to consider custom insoles for long-term management.  Landis Martins, DPM

## 2016-09-29 DIAGNOSIS — R262 Difficulty in walking, not elsewhere classified: Secondary | ICD-10-CM | POA: Diagnosis not present

## 2016-09-29 DIAGNOSIS — M25572 Pain in left ankle and joints of left foot: Secondary | ICD-10-CM | POA: Diagnosis not present

## 2016-09-29 DIAGNOSIS — M25675 Stiffness of left foot, not elsewhere classified: Secondary | ICD-10-CM | POA: Diagnosis not present

## 2016-09-29 DIAGNOSIS — M6281 Muscle weakness (generalized): Secondary | ICD-10-CM | POA: Diagnosis not present

## 2016-10-04 DIAGNOSIS — M6281 Muscle weakness (generalized): Secondary | ICD-10-CM | POA: Diagnosis not present

## 2016-10-04 DIAGNOSIS — M25572 Pain in left ankle and joints of left foot: Secondary | ICD-10-CM | POA: Diagnosis not present

## 2016-10-04 DIAGNOSIS — R262 Difficulty in walking, not elsewhere classified: Secondary | ICD-10-CM | POA: Diagnosis not present

## 2016-10-04 DIAGNOSIS — M25675 Stiffness of left foot, not elsewhere classified: Secondary | ICD-10-CM | POA: Diagnosis not present

## 2016-10-11 DIAGNOSIS — M25675 Stiffness of left foot, not elsewhere classified: Secondary | ICD-10-CM | POA: Diagnosis not present

## 2016-10-11 DIAGNOSIS — M6281 Muscle weakness (generalized): Secondary | ICD-10-CM | POA: Diagnosis not present

## 2016-10-11 DIAGNOSIS — M25572 Pain in left ankle and joints of left foot: Secondary | ICD-10-CM | POA: Diagnosis not present

## 2016-10-11 DIAGNOSIS — R262 Difficulty in walking, not elsewhere classified: Secondary | ICD-10-CM | POA: Diagnosis not present

## 2016-10-12 DIAGNOSIS — M5442 Lumbago with sciatica, left side: Secondary | ICD-10-CM | POA: Diagnosis not present

## 2016-10-18 DIAGNOSIS — R262 Difficulty in walking, not elsewhere classified: Secondary | ICD-10-CM | POA: Diagnosis not present

## 2016-10-18 DIAGNOSIS — M6281 Muscle weakness (generalized): Secondary | ICD-10-CM | POA: Diagnosis not present

## 2016-10-18 DIAGNOSIS — M25675 Stiffness of left foot, not elsewhere classified: Secondary | ICD-10-CM | POA: Diagnosis not present

## 2016-10-18 DIAGNOSIS — M25572 Pain in left ankle and joints of left foot: Secondary | ICD-10-CM | POA: Diagnosis not present

## 2016-10-22 DIAGNOSIS — I10 Essential (primary) hypertension: Secondary | ICD-10-CM | POA: Diagnosis not present

## 2016-10-22 DIAGNOSIS — Z6830 Body mass index (BMI) 30.0-30.9, adult: Secondary | ICD-10-CM | POA: Diagnosis not present

## 2016-10-22 DIAGNOSIS — Z23 Encounter for immunization: Secondary | ICD-10-CM | POA: Diagnosis not present

## 2016-10-22 DIAGNOSIS — J019 Acute sinusitis, unspecified: Secondary | ICD-10-CM | POA: Diagnosis not present

## 2016-10-22 DIAGNOSIS — B9689 Other specified bacterial agents as the cause of diseases classified elsewhere: Secondary | ICD-10-CM | POA: Diagnosis not present

## 2016-10-25 DIAGNOSIS — M25572 Pain in left ankle and joints of left foot: Secondary | ICD-10-CM | POA: Diagnosis not present

## 2016-10-25 DIAGNOSIS — M25675 Stiffness of left foot, not elsewhere classified: Secondary | ICD-10-CM | POA: Diagnosis not present

## 2016-10-25 DIAGNOSIS — R262 Difficulty in walking, not elsewhere classified: Secondary | ICD-10-CM | POA: Diagnosis not present

## 2016-10-25 DIAGNOSIS — M6281 Muscle weakness (generalized): Secondary | ICD-10-CM | POA: Diagnosis not present

## 2016-10-26 DIAGNOSIS — M5442 Lumbago with sciatica, left side: Secondary | ICD-10-CM | POA: Diagnosis not present

## 2016-10-26 DIAGNOSIS — G8929 Other chronic pain: Secondary | ICD-10-CM | POA: Diagnosis not present

## 2016-10-26 DIAGNOSIS — M48061 Spinal stenosis, lumbar region without neurogenic claudication: Secondary | ICD-10-CM | POA: Diagnosis not present

## 2016-10-29 ENCOUNTER — Ambulatory Visit (INDEPENDENT_AMBULATORY_CARE_PROVIDER_SITE_OTHER): Payer: Medicare HMO | Admitting: Sports Medicine

## 2016-10-29 DIAGNOSIS — M79672 Pain in left foot: Secondary | ICD-10-CM

## 2016-10-29 DIAGNOSIS — M79671 Pain in right foot: Secondary | ICD-10-CM

## 2016-10-29 DIAGNOSIS — M722 Plantar fascial fibromatosis: Secondary | ICD-10-CM | POA: Diagnosis not present

## 2016-10-29 NOTE — Progress Notes (Signed)
Subjective: Stacey Patterson is a 76 y.o. female returns to office for follow up evaluation on Left heel for plantar fasciitis, patient reports that she is feeling better. Pain is continuing to improve. States that therapy is helping and taping helps most. States that occasionally she will get a little pain in the arch on the right. However, nothing as significant as her left. Reports that she has finished all of her physical therapy. Denies any other pedal complaints.   Patient Active Problem List   Diagnosis Date Noted  . Spider veins of both lower extremities 05/04/2016  . Chest pain 12/07/2012  . ANXIETY 10/07/2008  . ARTHRITIS 10/07/2008  . HYPERLIPIDEMIA 10/01/2008  . HYPERTENSION 10/01/2008  . ANAL OR RECTAL PAIN 10/01/2008  . DIARRHEA 10/01/2008    Current Outpatient Prescriptions on File Prior to Visit  Medication Sig Dispense Refill  . FLUoxetine (PROZAC) 40 MG capsule 1 tab daily    . guaiFENesin (MUCINEX) 600 MG 12 hr tablet As needed    . loratadine (CLARITIN) 10 MG tablet Take 10 mg by mouth daily.    . methylPREDNISolone (MEDROL DOSEPAK) 4 MG TBPK tablet Take as instructed 21 tablet 0  . ranitidine (ZANTAC) 150 MG tablet Take 1 tablet (150 mg total) by mouth at bedtime. (Patient not taking: Reported on 05/04/2016) 30 tablet 1  . rosuvastatin (CRESTOR) 5 MG tablet Take 5 mg by mouth daily.    . valsartan-hydrochlorothiazide (DIOVAN-HCT) 320-12.5 MG per tablet Take 1 tablet by mouth daily.      Current Facility-Administered Medications on File Prior to Visit  Medication Dose Route Frequency Provider Last Rate Last Dose  . triamcinolone acetonide (KENALOG) 10 MG/ML injection 10 mg  10 mg Other Once Landis Martins, DPM      . triamcinolone acetonide (KENALOG) 10 MG/ML injection 10 mg  10 mg Other Once Landis Martins, DPM      . triamcinolone acetonide (KENALOG) 10 MG/ML injection 10 mg  10 mg Other Once Landis Martins, DPM        Allergies  Allergen Reactions  . Latex    . Naproxen   . Tape     Objective:   General:  Alert and oriented x 3, in no acute distress  Dermatology: Skin is warm, dry and supple bilateral lower extremities. Nails 1-10 are normal. There is no erythema, edema, no eccymosis, no open lesions present. Integument is otherwise unremarkable.  Vascular: Dorsalis Pedis pulse and Posterior Tibial pulse are 1/4 bilateral. Capillary fill time is <3 secs with varicosities bilateral.  Neurological: Grossly intact to light touch with an achilles reflex of +2/5 and a negative Tinel's sign bilateral.  Musculoskeletal: Minimal tenderness to palpation at the medial calcaneal tubercale and through the insertion of the plantar fascia on the left foot. Mild tenderness midarch at plantar fascia on right, + fat pad atrophy bilateral. There is no tenderness to anterior shin and anterior ankle on left. No pain with compression of calcaneus bilateral. No pain with tuning fork to calcaneus bilateral. No pain with calf compression bilateral. There is decreased Ankle joint range of motion bilateral. All other joints range of motion within normal limits bilateral. Strength 5/5 in all groups bilateral. Pes cavus bilateral.   Assessment and Plan: Problem List Items Addressed This Visit    None    Visit Diagnoses    Plantar fasciitis of left foot    -  Primary   Plantar fasciitis of right foot       Foot pain,  bilateral          -Complete examination performed.  -Re-Discussed with patient in detail the condition of plantar fasciitis, how this  occurs related to the foot type of the patient and general treatment options. -Applied fascial taping Bilateral and then after 5 days to return to fascial brace on left if needed -Continue with Night splint to use at bedtime as instructed  -Continue with stretching and icing daily.  -Discussed long term care and reocurrence; will closely monitor; if fails to improve will consider other treatment modalities. Advised  patient to consider custom molded orthotics office to call Patient to discuss coverage options. Also recommended patient to change her styles of shoes. A shoe list was provided. -Patient to return to office orthotics or sooner if problems or questions arise.   Landis Martins, DPM

## 2016-10-29 NOTE — Patient Instructions (Addendum)
For tennis shoes recommend:  Kandy Garrison Ascis New balance Saucony Can be purchased at [employer] or [employer]  Vionic  SAS Can be purchased at [employer] or [employer]   For work shoes recommend: [employer] Work [employer]  Can be purchased at a variety of places or Engineer, maintenance (IT)   For casual shoes recommend: Vionic  Can be purchased at [employer] or [employer]   STRAPPING INSTRUCTIONS  Strapping's need to be worn for 5 days for maximum benefit.  If you next appointment is before 5 days, please remove prior to coming in.  Try to avoid putting lotion on feet during the taping process.  The tape will not stick as well and will not be as effective.  If you were given stretching exercises, start these after removal of the tape.  These exercises will actually loosen the tape and you may not receive full benefit of the strapping.  It is important that you wear sturdy shoes at all times when walking.  Bedroom shoes, slippers, flip flops, etc. are not acceptable.  **If at any time while wearing the strapping you should notice any irritation such as a rash, redness, or itching, remove the tape and wash your foot/feet thoroughly.  BATHING INSTRUCTIONS  Take a washcloth, fold it a few times and tape it around your ankle.   Then take a garbage bag, place it over your foot and angle and tape it above and below the washcloth.  TAKE A QUICK SHOWER.  The washcloth should absorb any water that runs down into the garbage bag.  If your foot gets wet, take a towel and absorb as much of the water as possible. You can also take a blow dryer, put it on low heat, and dry your bandage.

## 2016-12-03 DIAGNOSIS — Z0000 Encounter for general adult medical examination without abnormal findings: Secondary | ICD-10-CM | POA: Diagnosis not present

## 2016-12-03 DIAGNOSIS — R69 Illness, unspecified: Secondary | ICD-10-CM | POA: Diagnosis not present

## 2016-12-03 DIAGNOSIS — N898 Other specified noninflammatory disorders of vagina: Secondary | ICD-10-CM | POA: Diagnosis not present

## 2016-12-03 DIAGNOSIS — R82998 Other abnormal findings in urine: Secondary | ICD-10-CM | POA: Diagnosis not present

## 2016-12-03 DIAGNOSIS — R319 Hematuria, unspecified: Secondary | ICD-10-CM | POA: Diagnosis not present

## 2016-12-03 DIAGNOSIS — J309 Allergic rhinitis, unspecified: Secondary | ICD-10-CM | POA: Diagnosis not present

## 2016-12-03 DIAGNOSIS — Z6830 Body mass index (BMI) 30.0-30.9, adult: Secondary | ICD-10-CM | POA: Diagnosis not present

## 2016-12-03 DIAGNOSIS — K449 Diaphragmatic hernia without obstruction or gangrene: Secondary | ICD-10-CM | POA: Diagnosis not present

## 2016-12-30 ENCOUNTER — Telehealth: Payer: Self-pay

## 2016-12-30 ENCOUNTER — Ambulatory Visit: Payer: Medicare HMO | Admitting: Allergy and Immunology

## 2016-12-30 ENCOUNTER — Encounter: Payer: Self-pay | Admitting: Allergy and Immunology

## 2016-12-30 VITALS — BP 124/70 | HR 80 | Temp 97.8°F | Resp 16 | Ht 61.34 in | Wt 165.6 lb

## 2016-12-30 DIAGNOSIS — J3089 Other allergic rhinitis: Secondary | ICD-10-CM | POA: Diagnosis not present

## 2016-12-30 DIAGNOSIS — L718 Other rosacea: Secondary | ICD-10-CM

## 2016-12-30 DIAGNOSIS — K219 Gastro-esophageal reflux disease without esophagitis: Secondary | ICD-10-CM

## 2016-12-30 MED ORDER — RANITIDINE HCL 300 MG PO TABS: 300.0000 mg | ORAL_TABLET | Freq: Every day | ORAL | 5 refills | Status: DC

## 2016-12-30 MED ORDER — DOXYCYCLINE MONOHYDRATE 100 MG PO TABS
100.0000 mg | ORAL_TABLET | Freq: Every day | ORAL | 5 refills | Status: DC
Start: 2016-12-30 — End: 2017-06-22

## 2016-12-30 MED ORDER — PANTOPRAZOLE SODIUM 40 MG PO TBEC: DELAYED_RELEASE_TABLET | ORAL | 5 refills | Status: DC

## 2016-12-30 MED ORDER — METRONIDAZOLE 0.75 % EX CREA: TOPICAL_CREAM | CUTANEOUS | 5 refills | Status: DC

## 2016-12-30 NOTE — Telephone Encounter (Signed)
Pt states that Metrocream will be over 100.00. Would like a less expensive alternative.

## 2016-12-30 NOTE — Progress Notes (Signed)
Dear Dr. Helene Kelp,  Thank you for referring Stacey Patterson to the [facility] on 12/30/2016.   Below is a summation of this patient's evaluation and recommendations.  Thank you for your referral. I will keep you informed about this patient's response to treatment.   If you have any questions please do not hesitate to contact me.   Sincerely,  Jiles Prows, MD Allergy / Immunology [facility]   ______________________________________________________________________    NEW PATIENT NOTE  Referring Provider: Ronita Hipps, MD Primary Provider: Ronita Hipps, MD Date of office visit: 12/30/2016    Subjective:   Chief Complaint:  Stacey Patterson (DOB: 1940/06/18) is a 76 y.o. female who presents to the clinic on 12/30/2016 with a chief complaint of Cough .     HPI: [name] presents to this clinic in evaluation of allergies.  She has 3 main issues.  First, she has sneezing and runny nose and rubbing of her nose.  This appears to be a perennial problem for many years duration without any obvious trigger.  There is no associated anosmia or decreased ability to taste.  She does have a history of tinnitus evaluated by an ENT doctor 4 years ago.  There is no associated hearing loss or vertigo.  She has tried a multitude of different medications to treat this issue which have been ineffective.  She has tried over-the-counter antihistamines and Flonase and antibiotics and nothing works.  Second, she has itchy, gritty, sandy, eyes and feeling as though there is stuff in her eyes.  In addition, she has issues with flushing of her face and redness of her face.  It is very easy for her to blush.  Third, she has constant postnasal drip and throat clearing and a little bit of a cough and intermittent raspy voice of many years duration.  She had to discontinue singing at church because of this issue and she  is attempting to restart her singing with less than optimal results recently.  She does have a history of heartburn presenting as chest pain and intermittent regurgitation and burning up into her throat.  She intermittently uses Zantac.  She has been using Zantac daily for the past 3 weeks yet still continues to remain symptomatic with her classic reflux symptoms.  She does not consume caffeinated drinks but does have chocolate many times per month.  She does not consume alcohol.  Past Medical History:  Diagnosis Date  . Allergy   . ANXIETY   . ARTHRITIS   . Arthritis    SHOULDERS  . Asthma    AS A CHILD  . Hiatal hernia   . HYPERLIPIDEMIA   . HYPERTENSION     Past Surgical History:  Procedure Laterality Date  . ABDOMINAL HYSTERECTOMY    . APPENDECTOMY    . CARPAL TUNNEL RELEASE    . COLONOSCOPY    . LUMBAR DISC SURGERY    . TONSILLECTOMY      Allergies as of 12/30/2016      Reactions   Naproxen    Tape    Latex Itching, Rash      Medication List      loratadine 10 MG tablet Commonly known as:  CLARITIN Take 10 mg by mouth daily.   ranitidine 150 MG tablet Commonly known as:  ZANTAC Take 1 tablet (150 mg total) by mouth at bedtime.   valsartan-hydrochlorothiazide 320-12.5  MG tablet Commonly known as:  DIOVAN-HCT Take 1 tablet by mouth daily.       Review of systems negative except as noted in HPI / PMHx or noted below:  Review of Systems  Constitutional: Negative.   HENT: Negative.   Eyes: Negative.   Respiratory: Negative.   Cardiovascular: Negative.   Gastrointestinal: Negative.   Genitourinary: Negative.   Musculoskeletal: Negative.   Skin: Negative.   Neurological: Negative.   Endo/Heme/Allergies: Negative.   Psychiatric/Behavioral: Negative.     Family History  Problem Relation Age of Onset  . CAD Father        MI in his 24s  . Diabetes Father   . Heart attack Brother        MI in his 59s  . CAD Sister   . Colon polyps Mother   .  Diabetes Sister   . Diabetes Brother   . Lung cancer Sister        smoker  . Lung cancer Brother        smoker  . Breast cancer Other        niece  . Cervical cancer Other        niece  . Cancer Maternal Uncle        type unknown  . Coronary artery disease Brother   . Allergy (severe) Son        Allergy to bees    Social History   Socioeconomic History  . Marital status: Widowed    Spouse name: Not on file  . Number of children: 3  . Years of education: Not on file  . Highest education level: Not on file  Social Needs  . Financial resource strain: Not on file  . Food insecurity - worry: Not on file  . Food insecurity - inability: Not on file  . Transportation needs - medical: Not on file  . Transportation needs - non-medical: Not on file  Occupational History  . 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 Smoking status: Never Smoker  . Smokeless tobacco: Never Used  Substance and Sexual Activity  . Alcohol use: No  . Drug use: No  . Sexual activity: Not on file  Other Topics Concern  . Not on file  Social History Narrative  . Not on file    Environmental and Social history  Lives in a house with a dry environment, 2 cats located inside the household, no carpet in the bedroom, plastic on the bed, no plastic on the pillow, and no smokers located inside the household.  Objective:   Vitals:   12/30/16 1005  BP: 124/70  Pulse: 80  Resp: 16  Temp: 97.8 F (36.6 C)   Height: 5' 1.34" (155.8 cm) Weight: 165 lb 9.6 oz (75.1 kg)  Physical Exam  Constitutional: She is well-developed, well-nourished, and in no distress.  HENT:  Head: Normocephalic.  Right Ear: Tympanic membrane, external ear and ear canal normal.  Left Ear: Tympanic membrane, external ear and ear canal normal.  Nose: Nose normal. No mucosal edema or rhinorrhea.  Mouth/Throat: Uvula is midline, oropharynx is clear and moist and mucous membranes are  normal. No oropharyngeal exudate.  Eyes: Conjunctivae are normal.  Neck: Trachea normal. No tracheal tenderness present. No tracheal deviation present. No thyromegaly present.  Cardiovascular: Normal rate, regular rhythm, S1 normal, S2 normal and normal heart sounds.  No murmur heard. Pulmonary/Chest: Breath sounds normal. No stridor. No respiratory distress. She  has no wheezes. She has no rales.  Musculoskeletal: She exhibits no edema.  Lymphadenopathy:       Head (right side): No tonsillar adenopathy present.       Head (left side): No tonsillar adenopathy present.    She has no cervical adenopathy.  Neurological: She is alert. Gait normal.  Skin: Rash (Facial erythema, induration, and telangiectasia) noted. She is not diaphoretic. No erythema. Nails show no clubbing.  Psychiatric: Mood and affect normal.    Diagnostics: Allergy skin tests were performed.  She did not demonstrate any hypersensitivity against a screening panel of aeroallergens or foods.  Spirometry was performed and demonstrated an FEV1 of 1.90 @ 102 % of predicted. FEV1/FVC = 0.77  Assessment and Plan:    1. Other allergic rhinitis   2. Ocular rosacea   3. LPRD (laryngopharyngeal reflux disease)     1.  Allergen avoidance measures  2.  Treat and prevent inflammation:   A.  OTC Nasacort 1 spray each nostril 1 time per day  3.  Treat and prevent reflux:   A.  Consolidate all chocolate consumption  B.  Pantoprazole 40 mg tablet in a.m.  C.  Ranitidine 300 mg tablet in p.m.  4.  Treat ocular rosacea:   A.  Doxycycline 100 mg tablet 1 time per day  B.  MetroCream applied to face twice a day  C.  Do not use any oral antihistamines  5.  If needed:   A.  Nasal saline  B.  OTC Systane eyedrops  6.  Return to clinic in 4 weeks or earlier if problem  Theone appears to have some inflammation of her upper airway but also inflammation of her conjunctiva which is most likely related to ocular rosacea and she  also has a history very consistent with LPR.  She will use therapy to address each one of these issues and I will see her back in this clinic in 4 weeks or earlier if there is a problem.  Jiles Prows, MD Allergy / Immunology [facility]

## 2016-12-30 NOTE — Telephone Encounter (Signed)
Called patient and advised her I called around and cheapest I found was $76 at [facility] for generic. I had thought maybe she was in donut hole and could hold off to next week and see if part d paid but she advised she was not.  She advised she would pay if you thought this was necessary but otherwise would like alt drug

## 2016-12-30 NOTE — Patient Instructions (Addendum)
  1.  Allergen avoidance measures  2.  Treat and prevent inflammation:   A.  OTC Nasacort 1 spray each nostril 1 time per day  3.  Treat and prevent reflux:   A.  Consolidate all chocolate consumption  B.  Pantoprazole 40 mg tablet in a.m.  C.  Ranitidine 300 mg tablet in p.m.  4.  Treat ocular rosacea:   A.  Doxycycline 100 mg tablet 1 time per day  B.  MetroCream applied to face twice a day  C.  Do not use any oral antihistamines  5.  If needed:   A.  Nasal saline  B.  OTC Systane eyedrops  6.  Return to clinic in 4 weeks or earlier if problem

## 2016-12-30 NOTE — Telephone Encounter (Signed)
She can hold off on the cream and just use the doxycycline until her RV.

## 2016-12-30 NOTE — Telephone Encounter (Signed)
Pharmacy called to state the cream was too expensive and would like an alternate but I see Kevan Ny has taken a message as well.

## 2016-12-30 NOTE — Telephone Encounter (Signed)
Please have her call around to find the least expensive generic. $100 seems like way too much money.

## 2016-12-31 ENCOUNTER — Encounter: Payer: Self-pay | Admitting: Allergy and Immunology

## 2016-12-31 NOTE — Telephone Encounter (Signed)
INFORMED PT OF WHAT DR K STATED

## 2017-01-07 NOTE — Addendum Note (Signed)
Addended by: Lucrezia Starch I on: 01/07/2017 07:58 AM   Modules accepted: Orders

## 2017-01-13 DIAGNOSIS — L578 Other skin changes due to chronic exposure to nonionizing radiation: Secondary | ICD-10-CM | POA: Diagnosis not present

## 2017-01-13 DIAGNOSIS — R233 Spontaneous ecchymoses: Secondary | ICD-10-CM | POA: Diagnosis not present

## 2017-01-27 ENCOUNTER — Ambulatory Visit: Payer: Medicare HMO | Admitting: Allergy and Immunology

## 2017-01-27 ENCOUNTER — Encounter: Payer: Self-pay | Admitting: Allergy and Immunology

## 2017-01-27 VITALS — BP 130/70 | HR 64 | Resp 16

## 2017-01-27 DIAGNOSIS — L718 Other rosacea: Secondary | ICD-10-CM | POA: Diagnosis not present

## 2017-01-27 DIAGNOSIS — K219 Gastro-esophageal reflux disease without esophagitis: Secondary | ICD-10-CM

## 2017-01-27 DIAGNOSIS — J3089 Other allergic rhinitis: Secondary | ICD-10-CM | POA: Diagnosis not present

## 2017-01-27 MED ORDER — LOTEPREDNOL ETABONATE 0.2 % OP SUSP: 1.0000 [drp] | Freq: Every day | OPHTHALMIC | 5 refills | Status: DC

## 2017-01-27 NOTE — Patient Instructions (Addendum)
  1.  Treat and prevent inflammation:   A.  Decrease OTC Nasacort 1 spray each nostril 3 times per week  2.  Continue to Treat and prevent reflux:   A.  Consolidate all chocolate consumption  B.  Pantoprazole 40 mg tablet in a.m.  C.  Ranitidine 300 mg tablet in p.m.  3.  Continue to Treat ocular rosacea:   A.  Doxycycline 100 mg tablet 1 time per day  B.  MetroCream applied to face twice a day  C.  Do not use any oral antihistamines  D.  Alrex - one drop each eye one time per day  4.  If needed:   A.  Nasal saline  B.  OTC Systane eyedrops  5.  Return to clinic in 8 weeks or earlier if problem

## 2017-01-27 NOTE — Progress Notes (Signed)
Follow-up Note  Referring Provider: Ronita Hipps, MD Primary Provider: Ronita Hipps, MD Date of Office Visit: 01/27/2017  Subjective:   Stacey Patterson (DOB: Jan 24, 1940) is a 77 y.o. female who returns to the [facility] [place] on 01/27/2017 in re-evaluation of the following:  HPI: Stacey Patterson returns to this clinic in reevaluation of allergic rhinitis, ocular rosacea, and LPR.  Her last visit to this clinic was her initial evaluation of 30 December 2016 at which point in time all 3 issues were addressed.  She believes that her throat is doing better.  She has less throat clearing and less postnasal drip although she is still a little "croupy".  She has not reattempted singing at church yet.  She has eliminated all classic reflux symptoms at this point.  She is remained away from consuming any chocolate and has been very good about using a combination of a proton pump inhibitor and H2 receptor blocker.  Her eyes are minimally better.  She has been using doxycycline and MetroCream and Systane.  She has not really been having much problems with her nose.  Allergies as of 01/27/2017      Reactions   Clarithromycin    Naproxen    Tape    Latex Itching, Rash      Medication List      doxycycline 100 MG tablet Commonly known as:  ADOXA Take 1 tablet (100 mg total) by mouth daily.   metroNIDAZOLE 0.75 % cream Commonly known as:  METROCREAM Apply to face twice daily as directed.   pantoprazole 40 MG tablet Commonly known as:  PROTONIX Take one tablet by mouth every morning.   ranitidine 300 MG tablet Commonly known as:  ZANTAC Take 1 tablet (300 mg total) by mouth at bedtime.   valsartan-hydrochlorothiazide 320-12.5 MG tablet Commonly known as:  DIOVAN-HCT Take 1 tablet by mouth daily.       Past Medical History:  Diagnosis Date  . Allergy   . ANXIETY   . ARTHRITIS   . Arthritis    SHOULDERS  . Asthma    AS A CHILD  . Hiatal hernia   . HYPERLIPIDEMIA    . HYPERTENSION     Past Surgical History:  Procedure Laterality Date  . ABDOMINAL HYSTERECTOMY    . APPENDECTOMY    . CARPAL TUNNEL RELEASE    . COLONOSCOPY    . LUMBAR DISC SURGERY    . TONSILLECTOMY      Review of systems negative except as noted in HPI / PMHx or noted below:  Review of Systems  Constitutional: Negative.   HENT: Negative.   Eyes: Negative.   Respiratory: Negative.   Cardiovascular: Negative.   Gastrointestinal: Negative.   Genitourinary: Negative.   Musculoskeletal: Negative.   Skin: Negative.   Neurological: Negative.   Endo/Heme/Allergies: Negative.   Psychiatric/Behavioral: Negative.      Objective:   Vitals:   01/27/17 1549  BP: 130/70  Pulse: 64  Resp: 16          Physical Exam  Constitutional: She is well-developed, well-nourished, and in no distress.  HENT:  Head: Normocephalic.  Right Ear: Tympanic membrane, external ear and ear canal normal.  Left Ear: Tympanic membrane, external ear and ear canal normal.  Nose: Nose normal. No mucosal edema or rhinorrhea.  Mouth/Throat: Uvula is midline, oropharynx is clear and moist and mucous membranes are normal. No oropharyngeal exudate.  Eyes: Conjunctivae are normal.  Neck: Trachea normal. No  tracheal tenderness present. No tracheal deviation present. No thyromegaly present.  Cardiovascular: Normal rate, regular rhythm, S1 normal, S2 normal and normal heart sounds.  No murmur heard. Pulmonary/Chest: Breath sounds normal. No stridor. No respiratory distress. She has no wheezes. She has no rales.  Musculoskeletal: She exhibits no edema.  Lymphadenopathy:       Head (right side): No tonsillar adenopathy present.       Head (left side): No tonsillar adenopathy present.    She has no cervical adenopathy.  Neurological: She is alert. Gait normal.  Skin: Rash (Facial erythema and telangiectasia) noted. She is not diaphoretic. No erythema. Nails show no clubbing.  Psychiatric: Mood and affect  normal.    Diagnostics: none   Assessment and Plan:   1. Other allergic rhinitis   2. Ocular rosacea   3. LPRD (laryngopharyngeal reflux disease)     1.  Treat and prevent inflammation:   A.  Decrease OTC Nasacort 1 spray each nostril 3 times per week  2.  Continue to Treat and prevent reflux:   A.  Consolidate all chocolate consumption  B.  Pantoprazole 40 mg tablet in a.m.  C.  Ranitidine 300 mg tablet in p.m.  3.  Continue to Treat ocular rosacea:   A.  Doxycycline 100 mg tablet 1 time per day  B.  MetroCream applied to face twice a day  C.  Do not use any oral antihistamines  D.  Alrex - one drop each eye one time per day  4.  If needed:   A.  Nasal saline  B.  OTC Systane eyedrops  5.  Return to clinic in 8 weeks or earlier if problem  It does appear as though Stacey Patterson is receiving improvement regarding her respiratory tract symptoms on her current plan and we will continue to have her use aggressive therapy directed against reflux and decrease her dose of nasal steroid for the next 8 weeks.  This will give her a total of 12 weeks of treatment and we will see what type of response we get at that point in time.  Her eyes have not improved significantly.  She has a combination of both ocular rosacea and dry eye.  She has been offered Restasis in the past but has refused to use this medication.  I will give her a very low dose topical steroid for her eyes at this point and we will see what happens over the course of the next month or 2.  I will see her back in this clinic in 8 weeks.  [name], MD Allergy / Immunology [place]

## 2017-01-28 ENCOUNTER — Telehealth: Payer: Self-pay | Admitting: Allergy and Immunology

## 2017-01-28 NOTE — Telephone Encounter (Signed)
Called and left message for Stacey Patterson to call back.

## 2017-01-28 NOTE — Telephone Encounter (Signed)
Stacey Patterson called in and stated she would like a different eye drop called in because the eye drops that were prescribed were over 200 dollars.  Please advise.

## 2017-01-28 NOTE — Telephone Encounter (Signed)
Can you please tell her that there is not an alternative. This prescription should be enough to last her for 4 months. She can call around to see if there is a price difference between pharmacies. Thank you

## 2017-01-30 ENCOUNTER — Encounter: Payer: Self-pay | Admitting: Allergy and Immunology

## 2017-01-31 ENCOUNTER — Telehealth: Payer: Self-pay | Admitting: Allergy and Immunology

## 2017-01-31 NOTE — Telephone Encounter (Signed)
Stacey Patterson called in and stated she couldn't get the eye drops because of the cost.  I spoke with Stacey Patterson and Stacey Patterson and we asked Stacey Patterson to call [employer] and see if they had a specialty pharmacy we could send the eye drops cheaper.  Stacey Patterson called back and stated she had a deductible and even after that is met the eyes drops will be $150 and she can't afford that.  Stacey Patterson wants to know if there is any other eyes drops or anything else that can be called in that won't be so expensive.

## 2017-01-31 NOTE — Telephone Encounter (Signed)
I left a detailed message on phone for patient advising her of this. I also advised that if she had any further questions to give [place] a call back.

## 2017-01-31 NOTE — Telephone Encounter (Signed)
Patient called back and states that the medication is just too expensive.

## 2017-01-31 NOTE — Telephone Encounter (Signed)
Please inform patient that other steroid eye drops are too strong to be used on a daily basis. She can use loteprednol 0.5% one drop each eye every other day.

## 2017-02-16 DIAGNOSIS — Z6830 Body mass index (BMI) 30.0-30.9, adult: Secondary | ICD-10-CM | POA: Diagnosis not present

## 2017-02-16 DIAGNOSIS — I10 Essential (primary) hypertension: Secondary | ICD-10-CM | POA: Diagnosis not present

## 2017-02-16 DIAGNOSIS — Z79899 Other long term (current) drug therapy: Secondary | ICD-10-CM | POA: Diagnosis not present

## 2017-02-16 DIAGNOSIS — E785 Hyperlipidemia, unspecified: Secondary | ICD-10-CM | POA: Diagnosis not present

## 2017-02-16 DIAGNOSIS — M5442 Lumbago with sciatica, left side: Secondary | ICD-10-CM | POA: Diagnosis not present

## 2017-02-23 ENCOUNTER — Telehealth: Payer: Self-pay | Admitting: *Deleted

## 2017-02-23 ENCOUNTER — Ambulatory Visit: Payer: Medicare HMO | Admitting: Sports Medicine

## 2017-02-23 ENCOUNTER — Encounter: Payer: Self-pay | Admitting: Sports Medicine

## 2017-02-23 DIAGNOSIS — S96912A Strain of unspecified muscle and tendon at ankle and foot level, left foot, initial encounter: Secondary | ICD-10-CM

## 2017-02-23 DIAGNOSIS — M79671 Pain in right foot: Secondary | ICD-10-CM

## 2017-02-23 DIAGNOSIS — I739 Peripheral vascular disease, unspecified: Secondary | ICD-10-CM

## 2017-02-23 DIAGNOSIS — M25572 Pain in left ankle and joints of left foot: Secondary | ICD-10-CM

## 2017-02-23 DIAGNOSIS — M79672 Pain in left foot: Secondary | ICD-10-CM

## 2017-02-23 DIAGNOSIS — M722 Plantar fascial fibromatosis: Secondary | ICD-10-CM

## 2017-02-23 MED ORDER — TRIAMCINOLONE ACETONIDE 10 MG/ML IJ SUSP: 10.0000 mg | Freq: Once | INTRAMUSCULAR | Status: AC

## 2017-02-23 NOTE — Addendum Note (Signed)
Addended by: Landis Martins T on: 02/23/2017 06:22 PM   Modules accepted: Orders

## 2017-02-23 NOTE — Telephone Encounter (Signed)
Orders to J. Quintana, RN for pre-cert. 

## 2017-02-23 NOTE — Progress Notes (Addendum)
Subjective: Stacey Patterson is a 77 y.o. female returns to office for follow up evaluation on Left heel for plantar fasciitis, patient reports that the pain is getting worse again states that since her last visit the pain never completely went away.  Patient reports that she has been continuing to stretch and use good supportive shoes.  Patient has not use of night splint because she did not remember that I want her to.  Patient also admits to a new area of pain and swelling on the lateral side of her left ankle states that she has noticed a pea-sized bump to the side of her left ankle and occasional swelling and states that this area is becoming concerning.  Patient denies any injury sprain fall or any causative factors for the new area of pain on the left ankle.  Denies warmth redness nausea vomiting fever chills or any other constitutional symptoms at this time. Denies any other pedal complaints.   Patient Active Problem List   Diagnosis Date Noted  . Spider veins of both lower extremities 05/04/2016  . Chest pain 12/07/2012  . ANXIETY 10/07/2008  . ARTHRITIS 10/07/2008  . HYPERLIPIDEMIA 10/01/2008  . HYPERTENSION 10/01/2008  . ANAL OR RECTAL PAIN 10/01/2008  . DIARRHEA 10/01/2008    Current Outpatient Medications on File Prior to Visit  Medication Sig Dispense Refill  . doxycycline (ADOXA) 100 MG tablet Take 1 tablet (100 mg total) by mouth daily. 30 tablet 5  . loteprednol (LOTEMAX) 0.2 % SUSP Place 1 drop into both eyes daily. 10 mL 5  . metroNIDAZOLE (METROCREAM) 0.75 % cream Apply to face twice daily as directed. 45 g 5  . pantoprazole (PROTONIX) 40 MG tablet Take one tablet by mouth every morning. 30 tablet 5  . ranitidine (ZANTAC) 300 MG tablet Take 1 tablet (300 mg total) by mouth at bedtime. 30 tablet 5  . valsartan-hydrochlorothiazide (DIOVAN-HCT) 320-12.5 MG per tablet Take 1 tablet by mouth daily.      No current facility-administered medications on file prior to visit.      Allergies  Allergen Reactions  . Clarithromycin   . Naproxen   . Tape   . Latex Itching and Rash    Objective:   General:  Alert and oriented x 3, in no acute distress  Dermatology: Skin is warm, dry and supple bilateral lower extremities. Nails 1-10 are normal. There is no erythema, edema, no eccymosis, no open lesions present. Integument is otherwise unremarkable.  Vascular: Dorsalis Pedis pulse and Posterior Tibial pulse are 1/4 bilateral. Capillary fill time is <3 secs with varicosities bilateral.  Neurological: Grossly intact to light touch with an achilles reflex of +2/5 and a negative Tinel's sign bilateral.  Musculoskeletal: Moderate tenderness to palpation at the medial calcaneal tubercale and through the insertion of the plantar fascia on the left foot. Mild tenderness midarch at plantar fascia on right, + pain with palpation to lateral ankle along the peroneal tendon course there is pain the cuboid joint with a palpable bone spur on the left.  There is fat pad atrophy bilateral. No pain with compression of calcaneus bilateral. No pain with tuning fork to calcaneus bilateral. No pain with calf compression bilateral. There is decreased Ankle joint range of motion bilateral. All other joints range of motion within normal limits bilateral. Strength 5/5 in all groups bilateral. Pes cavus bilateral.   Assessment and Plan: Problem List Items Addressed This Visit    None    Visit Diagnoses  Plantar fasciitis of left foot    -  Primary   Relevant Medications   triamcinolone acetonide (KENALOG) 10 MG/ML injection 10 mg   Plantar fasciitis of right foot       Relevant Medications   triamcinolone acetonide (KENALOG) 10 MG/ML injection 10 mg   Foot pain, bilateral       Acute left ankle pain       Tear of tendon of left ankle, initial encounter       PVD (peripheral vascular disease) (HCC)         -Complete examination performed.  -Discussed treatment options for  possible tendinitis versus peroneal tendon tear with bone spur at the lateral side of her left ankle -Recommend patient to return to her plantar fascial brace to support her foot and ankle take stress off the lateral side to prevent her from over compensating causing a tendon tear or rupture -Recommend ice elevation and rest for the left lateral ankle -Re-Discussed with patient in detail the condition of plantar fasciitis, how this occurs related to the foot type of the patient and general treatment options. -After oral consent and aseptic prep, injected a mixture containing 1 ml of 2%  plain lidocaine, 1 ml 0.5% plain marcaine, 0.5 ml of kenalog 10 and 0.5 ml of dexamethasone phosphate into right and left heels without complication. Post-injection care discussed with patient.  -Resume with Night splint to use at bedtime as instructed  -Continue with stretching and icing daily.  -Discussed long term care and reocurrence; will closely monitor; if fails to improve will consider custom insoles of which patient is interested in getting and other treatment modalities like EPAT.  A brochure was given to patient.  -Patient to return to office after MRI and if negative will proceed with orthotics other treatment options. Landis Martins, DPM

## 2017-02-23 NOTE — Telephone Encounter (Signed)
-----   Message from [place], [place] sent at 02/23/2017  9:31 AM EST ----- Regarding: L ankle MRI R/o Peroneal tendon tear at lateral ankle

## 2017-02-25 NOTE — Telephone Encounter (Signed)
[facility] scheduled pt 03/01/2017 arrival 11:45am for 12:00pm MRI.

## 2017-02-25 NOTE — Telephone Encounter (Signed)
I informed pt of 03/01/2017 MRI appt.

## 2017-03-01 DIAGNOSIS — S96912A Strain of unspecified muscle and tendon at ankle and foot level, left foot, initial encounter: Secondary | ICD-10-CM | POA: Diagnosis not present

## 2017-03-01 DIAGNOSIS — M66872 Spontaneous rupture of other tendons, left ankle and foot: Secondary | ICD-10-CM | POA: Diagnosis not present

## 2017-03-01 DIAGNOSIS — X58XXXA Exposure to other specified factors, initial encounter: Secondary | ICD-10-CM | POA: Diagnosis not present

## 2017-03-08 DIAGNOSIS — E079 Disorder of thyroid, unspecified: Secondary | ICD-10-CM | POA: Diagnosis not present

## 2017-03-08 DIAGNOSIS — Z6830 Body mass index (BMI) 30.0-30.9, adult: Secondary | ICD-10-CM | POA: Diagnosis not present

## 2017-03-08 DIAGNOSIS — J40 Bronchitis, not specified as acute or chronic: Secondary | ICD-10-CM | POA: Diagnosis not present

## 2017-03-11 ENCOUNTER — Ambulatory Visit: Payer: Medicare HMO | Admitting: Sports Medicine

## 2017-03-11 ENCOUNTER — Encounter: Payer: Self-pay | Admitting: Sports Medicine

## 2017-03-11 DIAGNOSIS — S96912D Strain of unspecified muscle and tendon at ankle and foot level, left foot, subsequent encounter: Secondary | ICD-10-CM | POA: Diagnosis not present

## 2017-03-11 DIAGNOSIS — M722 Plantar fascial fibromatosis: Secondary | ICD-10-CM | POA: Diagnosis not present

## 2017-03-11 DIAGNOSIS — M79672 Pain in left foot: Secondary | ICD-10-CM

## 2017-03-11 DIAGNOSIS — I739 Peripheral vascular disease, unspecified: Secondary | ICD-10-CM

## 2017-03-11 DIAGNOSIS — M79671 Pain in right foot: Secondary | ICD-10-CM

## 2017-03-11 NOTE — Progress Notes (Signed)
Subjective: Stacey Patterson is a 77 y.o. female returns to office for follow up evaluation on Left heel for plantar fasciitis and left ankle pain, patient reports that she has no pain in the heel after her last injection the heel was sore and bruised however feels much better today.  Patient reports that she has been using her ankle support and fascial brace with no pain on the side of her foot.  Patient is also here today for MRI results. Denies any other pedal complaints.   Patient Active Problem List   Diagnosis Date Noted  . Spider veins of both lower extremities 05/04/2016  . Chest pain 12/07/2012  . ANXIETY 10/07/2008  . ARTHRITIS 10/07/2008  . HYPERLIPIDEMIA 10/01/2008  . HYPERTENSION 10/01/2008  . ANAL OR RECTAL PAIN 10/01/2008  . DIARRHEA 10/01/2008    Current Outpatient Medications on File Prior to Visit  Medication Sig Dispense Refill  . doxycycline (ADOXA) 100 MG tablet Take 1 tablet (100 mg total) by mouth daily. 30 tablet 5  . loteprednol (LOTEMAX) 0.2 % SUSP Place 1 drop into both eyes daily. 10 mL 5  . metroNIDAZOLE (METROCREAM) 0.75 % cream Apply to face twice daily as directed. 45 g 5  . pantoprazole (PROTONIX) 40 MG tablet Take one tablet by mouth every morning. 30 tablet 5  . ranitidine (ZANTAC) 300 MG tablet Take 1 tablet (300 mg total) by mouth at bedtime. 30 tablet 5  . valsartan-hydrochlorothiazide (DIOVAN-HCT) 320-12.5 MG per tablet Take 1 tablet by mouth daily.      Current Facility-Administered Medications on File Prior to Visit  Medication Dose Route Frequency Provider Last Rate Last Dose  . triamcinolone acetonide (KENALOG) 10 MG/ML injection 10 mg  10 mg Other Once Landis Martins, DPM        Allergies  Allergen Reactions  . Clarithromycin   . Naproxen   . Tape   . Latex Itching and Rash    Objective:   General:  Alert and oriented x 3, in no acute distress  Dermatology: Skin is warm, dry and supple bilateral lower extremities. Nails 1-10 are  normal. There is no erythema, edema, no eccymosis, no open lesions present. Integument is otherwise unremarkable.  Vascular: Dorsalis Pedis pulse and Posterior Tibial pulse are 1/4 bilateral. Capillary fill time is <3 secs with varicosities bilateral.  Neurological: Grossly intact to light touch with an achilles reflex of +2/5 and a negative Tinel's sign bilateral.  Musculoskeletal: No tenderness to palpation at the medial calcaneal tubercale and through the insertion of the plantar fascia on the left foot.   No tenderness midarch at plantar fascia on right, very minimal pain with palpation to lateral ankle along the peroneal tendon course there is no current pain the cuboid joint with a palpable bone spur on the left.  There is fat pad atrophy bilateral. No pain with compression of calcaneus bilateral. No pain with tuning fork to calcaneus bilateral. No pain with calf compression bilateral. There is decreased Ankle joint range of motion bilateral. All other joints range of motion within normal limits bilateral. Strength 5/5 in all groups bilateral. Pes cavus bilateral.   Assessment and Plan: Problem List Items Addressed This Visit    None    Visit Diagnoses    Tear of tendon of left ankle, subsequent encounter    -  Primary   short segment tear of peroneus brevis tendon with tendonosis   Plantar fasciitis of left foot       Plantar fasciitis of  right foot       Foot pain, bilateral       PVD (peripheral vascular disease) ([place])         -Complete examination performed.  -Discussed treatment options for short segment tear of peroneal brevis tendon with bone spur at the lateral side of her left ankle and plantar fasciitis -Recommend patient to continue with plantar fascial bracing to reduce stress on the lateral side of her ankle and ankle support socks: Patient will be a great candidate for a long-term ankle brace to alleviate excessive stress and strain on the lateral ankle to prevent  further injury or further tear of her peroneal brevis tendon -Patient may also be a good candidate for custom molded insoles patient prefers a very soft accommodatin type of insole to help prevent recurrence of plantar fasciitis  -Recommend rest ice elevation as needed -May continue with with Night splint to use at bedtime as instructed  -Patient to return to office for brace and orthotic evaluation with Betha.  Landis Martins, DPM

## 2017-03-17 ENCOUNTER — Ambulatory Visit (INDEPENDENT_AMBULATORY_CARE_PROVIDER_SITE_OTHER): Payer: Medicare HMO | Admitting: *Deleted

## 2017-03-17 DIAGNOSIS — M722 Plantar fascial fibromatosis: Secondary | ICD-10-CM | POA: Diagnosis not present

## 2017-03-17 DIAGNOSIS — S96912D Strain of unspecified muscle and tendon at ankle and foot level, left foot, subsequent encounter: Secondary | ICD-10-CM

## 2017-03-17 DIAGNOSIS — M25572 Pain in left ankle and joints of left foot: Secondary | ICD-10-CM

## 2017-03-31 ENCOUNTER — Ambulatory Visit: Payer: Medicare HMO | Admitting: Allergy and Immunology

## 2017-03-31 ENCOUNTER — Encounter: Payer: Self-pay | Admitting: Allergy and Immunology

## 2017-03-31 VITALS — BP 150/80 | HR 80 | Resp 18

## 2017-03-31 DIAGNOSIS — K219 Gastro-esophageal reflux disease without esophagitis: Secondary | ICD-10-CM | POA: Diagnosis not present

## 2017-03-31 DIAGNOSIS — L718 Other rosacea: Secondary | ICD-10-CM

## 2017-03-31 DIAGNOSIS — J3089 Other allergic rhinitis: Secondary | ICD-10-CM | POA: Diagnosis not present

## 2017-03-31 DIAGNOSIS — J0140 Acute pansinusitis, unspecified: Secondary | ICD-10-CM

## 2017-03-31 MED ORDER — METHYLPREDNISOLONE ACETATE 80 MG/ML IJ SUSP
80.0000 mg | Freq: Once | INTRAMUSCULAR | Status: AC
  Administered 2017-03-31: 80 mg via INTRAMUSCULAR

## 2017-03-31 MED ORDER — AMOXICILLIN-POT CLAVULANATE 875-125 MG PO TABS: ORAL_TABLET | ORAL | 0 refills | Status: DC

## 2017-03-31 NOTE — Progress Notes (Signed)
Follow-up Note  Referring Provider: Ronita Hipps, MD Primary Provider: Ronita Hipps, MD Date of Office Visit: 03/31/2017  Subjective:   Stacey Patterson (DOB: 04-Mar-1940) is a 77 y.o. female who returns to the [facility] on 03/31/2017 in re-evaluation of the following:  HPI: Stacey Patterson returns to this clinic in reevaluation of her allergic rhinitis, ocular rosacea, and LPR.  Her last visit to this clinic was 27 January 2017 at which point in time she was doing relatively well regarding her issues although her eyes were minimally improved.  She was doing very well and in fact had even more improvement regarding all of her respiratory tract symptoms and even her eyes on her current plan which included anti-inflammatory medications for her respiratory tract and therapy directed against reflux and therapy directed against ocular rosacea.  Unfortunately, about 3 weeks ago she developed a sinus infection manifested as yellow-green bloody nasal discharge with fever and feeling bad with chills for which she saw Dr. Helene Kelp and was treated with a 5-day antibiotic and a decongestant.  She has never completely resolved that issue and still has lots of nasal congestion and occasional ugly nasal discharge and has head fullness and a low-grade headache in her frontal region as well as drainage.  Allergies as of 03/31/2017      Reactions   Clarithromycin    Naproxen    Tape    Latex Itching, Rash      Medication List      doxycycline 100 MG tablet Commonly known as:  ADOXA Take 1 tablet (100 mg total) by mouth daily.   HAIR SKIN NAILS PO Take by mouth.   metroNIDAZOLE 0.75 % cream Commonly known as:  METROCREAM Apply to face twice daily as directed.   NOREL AD 4-10-325 MG Tabs Generic drug:  Chlorphen-PE-Acetaminophen Take 1 tablet by mouth every 6 (six) hours.   pantoprazole 40 MG tablet Commonly known as:  PROTONIX Take one tablet by mouth every morning.     ranitidine 300 MG tablet Commonly known as:  ZANTAC Take 1 tablet (300 mg total) by mouth at bedtime.   SYSTANE OP Apply to eye.   valsartan-hydrochlorothiazide 320-12.5 MG tablet Commonly known as:  DIOVAN-HCT Take 1 tablet by mouth daily.       Past Medical History:  Diagnosis Date  . Allergy   . ANXIETY   . ARTHRITIS   . Arthritis    SHOULDERS  . Asthma    AS A CHILD  . Hiatal hernia   . HYPERLIPIDEMIA   . HYPERTENSION     Past Surgical History:  Procedure Laterality Date  . ABDOMINAL HYSTERECTOMY    . APPENDECTOMY    . CARPAL TUNNEL RELEASE    . COLONOSCOPY    . LUMBAR DISC SURGERY    . TONSILLECTOMY      Review of systems negative except as noted in HPI / PMHx or noted below:  Review of Systems  Constitutional: Negative.   HENT: Negative.   Eyes: Negative.   Respiratory: Negative.   Cardiovascular: Negative.   Gastrointestinal: Negative.   Genitourinary: Negative.   Musculoskeletal: Negative.   Skin: Negative.   Neurological: Negative.   Endo/Heme/Allergies: Negative.   Psychiatric/Behavioral: Negative.      Objective:   Vitals:   03/31/17 1008  BP: (!) 150/80  Pulse: 80  Resp: 18          Physical Exam  Constitutional: She is well-developed, well-nourished, and in  no distress.  Allergic shiners  HENT:  Head: Normocephalic.  Right Ear: Tympanic membrane, external ear and ear canal normal.  Left Ear: External ear and ear canal normal. A middle ear effusion (Dull light reflex tympanic membrane) is present.  Nose: Nose normal. No mucosal edema or rhinorrhea.  Mouth/Throat: Uvula is midline, oropharynx is clear and moist and mucous membranes are normal. No oropharyngeal exudate.  Eyes: Conjunctivae are normal.  Neck: Trachea normal. No tracheal tenderness present. No tracheal deviation present. No thyromegaly present.  Cardiovascular: Normal rate, regular rhythm, S1 normal, S2 normal and normal heart sounds.  No murmur  heard. Pulmonary/Chest: Breath sounds normal. No stridor. No respiratory distress. She has no wheezes. She has no rales.  Musculoskeletal: She exhibits no edema.  Lymphadenopathy:       Head (right side): No tonsillar adenopathy present.       Head (left side): No tonsillar adenopathy present.    She has no cervical adenopathy.  Neurological: She is alert. Gait normal.  Skin: No rash noted. She is not diaphoretic. No erythema. Nails show no clubbing.  Psychiatric: Mood and affect normal.    Diagnostics: none  Assessment and Plan:   1. Other allergic rhinitis   2. Ocular rosacea   3. LPRD (laryngopharyngeal reflux disease)   4. Acute pansinusitis, recurrence not specified     1.  Treat and prevent inflammation:   A.  OTC Nasacort 1 spray each nostril 3 times per week  2.  Continue to Treat and prevent reflux:   A.  Consolidate all chocolate consumption  B.  Pantoprazole 40 mg tablet in a.m.  C.  Ranitidine 300 mg tablet in p.m.  3.  Continue to Treat ocular rosacea:   A.  Doxycycline 100 mg tablet 1 time per day  B.  MetroCream applied to face twice a day  C.  Do not use any oral antihistamines  4.  If needed:   A.  Nasal saline  B.  OTC Systane eyedrops  5. For the recent episode:   A. Augmentin 875 one tablet two times per day for 10 days  B.  Depo-Medrol 80 IM delivered in clinic today  6.  Return to clinic in summer 2019 or earlier if problem  Lada appears to have complicated her condition with the development of an acute episode of sinusitis that did not respond adequately to very good medical therapy administered by Dr. Helene Kelp and we will now have her utilize a systemic steroid and a broad-spectrum antibiotic to try to clear up any remaining inflammation and infection.  Overall she has really done well with her plan which includes pretty significant medical therapy directed against inflammation of both her airway and eye and reflux.  We will keep her on this plan  and see her back in this clinic in the summer 2019 or earlier if there is a problem.  [name], MD Allergy / Immunology [place]

## 2017-03-31 NOTE — Patient Instructions (Addendum)
  1.  Treat and prevent inflammation:   A.  OTC Nasacort 1 spray each nostril 3 times per week  2.  Continue to Treat and prevent reflux:   A.  Consolidate all chocolate consumption  B.  Pantoprazole 40 mg tablet in a.m.  C.  Ranitidine 300 mg tablet in p.m.  3.  Continue to Treat ocular rosacea:   A.  Doxycycline 100 mg tablet 1 time per day  B.  MetroCream applied to face twice a day  C.  Do not use any oral antihistamines  4.  If needed:   A.  Nasal saline  B.  OTC Systane eyedrops  5. For the recent episode:   A. Augmentin 875 one tablet two times per day for 10 days  B.  Depo-Medrol 80 IM delivered in clinic today  6.  Return to clinic in summer 2019 or earlier if problem

## 2017-04-04 ENCOUNTER — Encounter: Payer: Self-pay | Admitting: Allergy and Immunology

## 2017-04-07 ENCOUNTER — Other Ambulatory Visit: Payer: Medicare HMO

## 2017-04-13 DIAGNOSIS — R233 Spontaneous ecchymoses: Secondary | ICD-10-CM | POA: Diagnosis not present

## 2017-04-13 DIAGNOSIS — L570 Actinic keratosis: Secondary | ICD-10-CM | POA: Diagnosis not present

## 2017-04-13 DIAGNOSIS — L578 Other skin changes due to chronic exposure to nonionizing radiation: Secondary | ICD-10-CM | POA: Diagnosis not present

## 2017-05-09 NOTE — Progress Notes (Signed)
Patient ID: Stacey Patterson, female   DOB: 1940-02-28, 77 y.o.   MRN: [id number]   Patient presents at Dr. Leeanne Rio request to be casted for a brace and custom molded orthotics with [facility] Certified Pedorthist.  Patient will return in when both the orthotics and brace have arrived.

## 2017-05-19 ENCOUNTER — Ambulatory Visit (INDEPENDENT_AMBULATORY_CARE_PROVIDER_SITE_OTHER): Payer: Medicare HMO | Admitting: Sports Medicine

## 2017-05-19 DIAGNOSIS — M722 Plantar fascial fibromatosis: Secondary | ICD-10-CM

## 2017-05-19 DIAGNOSIS — M25572 Pain in left ankle and joints of left foot: Secondary | ICD-10-CM

## 2017-05-19 DIAGNOSIS — I739 Peripheral vascular disease, unspecified: Secondary | ICD-10-CM

## 2017-05-19 DIAGNOSIS — S96912D Strain of unspecified muscle and tendon at ankle and foot level, left foot, subsequent encounter: Secondary | ICD-10-CM | POA: Diagnosis not present

## 2017-05-19 NOTE — Patient Instructions (Signed)

## 2017-05-25 DIAGNOSIS — M722 Plantar fascial fibromatosis: Secondary | ICD-10-CM | POA: Diagnosis not present

## 2017-05-25 DIAGNOSIS — J309 Allergic rhinitis, unspecified: Secondary | ICD-10-CM | POA: Diagnosis not present

## 2017-05-25 DIAGNOSIS — K219 Gastro-esophageal reflux disease without esophagitis: Secondary | ICD-10-CM | POA: Diagnosis not present

## 2017-05-25 DIAGNOSIS — H04129 Dry eye syndrome of unspecified lacrimal gland: Secondary | ICD-10-CM | POA: Diagnosis not present

## 2017-05-25 DIAGNOSIS — E669 Obesity, unspecified: Secondary | ICD-10-CM | POA: Diagnosis not present

## 2017-05-25 DIAGNOSIS — Z6830 Body mass index (BMI) 30.0-30.9, adult: Secondary | ICD-10-CM | POA: Diagnosis not present

## 2017-05-25 DIAGNOSIS — Z8249 Family history of ischemic heart disease and other diseases of the circulatory system: Secondary | ICD-10-CM | POA: Diagnosis not present

## 2017-05-25 DIAGNOSIS — G8929 Other chronic pain: Secondary | ICD-10-CM | POA: Diagnosis not present

## 2017-05-25 DIAGNOSIS — I10 Essential (primary) hypertension: Secondary | ICD-10-CM | POA: Diagnosis not present

## 2017-06-02 NOTE — Progress Notes (Signed)
Patient ID: Stacey Patterson, female   DOB: 10-19-1940, 77 y.o.   MRN: [id number]   Patient presents for fitting of [place] brace and custom molded orthotics with [employer] certified [employer]. Written and verbal break in instructions given. Patient will follow up in 6 weeks with Dr. Cannon Kettle.

## 2017-06-06 DIAGNOSIS — R300 Dysuria: Secondary | ICD-10-CM | POA: Diagnosis not present

## 2017-06-06 DIAGNOSIS — Z6830 Body mass index (BMI) 30.0-30.9, adult: Secondary | ICD-10-CM | POA: Diagnosis not present

## 2017-06-09 ENCOUNTER — Ambulatory Visit: Payer: Medicare HMO | Admitting: Sports Medicine

## 2017-06-09 ENCOUNTER — Encounter: Payer: Self-pay | Admitting: Sports Medicine

## 2017-06-09 DIAGNOSIS — M79671 Pain in right foot: Secondary | ICD-10-CM

## 2017-06-09 DIAGNOSIS — S96912D Strain of unspecified muscle and tendon at ankle and foot level, left foot, subsequent encounter: Secondary | ICD-10-CM | POA: Diagnosis not present

## 2017-06-09 DIAGNOSIS — M722 Plantar fascial fibromatosis: Secondary | ICD-10-CM | POA: Diagnosis not present

## 2017-06-09 DIAGNOSIS — I739 Peripheral vascular disease, unspecified: Secondary | ICD-10-CM | POA: Diagnosis not present

## 2017-06-09 DIAGNOSIS — M79672 Pain in left foot: Secondary | ICD-10-CM | POA: Diagnosis not present

## 2017-06-09 NOTE — Patient Instructions (Signed)
REFLEXOLOGY

## 2017-06-09 NOTE — Progress Notes (Signed)
Subjective: Stacey Patterson is a 77 y.o. female returns to office for follow up evaluation on Left heel for plantar fasciitis and left ankle pain at the area of the tendon tear.  Patient is also here for follow-up after being dispensed orthotics for right and AFO for left.  Patient reports that her orthotic and bracing seems like it is helping and that her pain is improving states that some days she still feels a pulling along the arch and feels like her plantar fascia is tight and also states that sometimes she does feel a little bit of tingling which has been relieved and will control utilizing Aspercreme or any other acute symptoms since she has been wearing orthotic and AFO.  Patient denies redness, swelling, bruising, denies any other pedal complaints.   Orthotic on the right and brace appear to contour well providing adequate support and control  Patient Active Problem List   Diagnosis Date Noted  . Spider veins of both lower extremities 05/04/2016  . Chest pain 12/07/2012  . ANXIETY 10/07/2008  . ARTHRITIS 10/07/2008  . HYPERLIPIDEMIA 10/01/2008  . HYPERTENSION 10/01/2008  . ANAL OR RECTAL PAIN 10/01/2008  . DIARRHEA 10/01/2008    Current Outpatient Medications on File Prior to Visit  Medication Sig Dispense Refill  . amoxicillin-clavulanate (AUGMENTIN) 875-125 MG tablet Take one tablet twice daily for 10 days 20 tablet 0  . doxycycline (ADOXA) 100 MG tablet Take 1 tablet (100 mg total) by mouth daily. 30 tablet 5  . metroNIDAZOLE (METROCREAM) 0.75 % cream Apply to face twice daily as directed. 45 g 5  . Multiple Vitamins-Minerals (HAIR SKIN NAILS PO) Take by mouth.    Renda Rolls AD 4-10-325 MG TABS Take 1 tablet by mouth every 6 (six) hours.  0  . pantoprazole (PROTONIX) 40 MG tablet Take one tablet by mouth every morning. 30 tablet 5  . Polyethyl Glycol-Propyl Glycol (SYSTANE OP) Apply to eye.    . ranitidine (ZANTAC) 300 MG tablet Take 1 tablet (300 mg total) by mouth at bedtime. 30  tablet 5  . valsartan-hydrochlorothiazide (DIOVAN-HCT) 320-12.5 MG per tablet Take 1 tablet by mouth daily.      Current Facility-Administered Medications on File Prior to Visit  Medication Dose Route Frequency Provider Last Rate Last Dose  . triamcinolone acetonide (KENALOG) 10 MG/ML injection 10 mg  10 mg Other Once Landis Martins, DPM        Allergies  Allergen Reactions  . Clarithromycin   . Naproxen   . Tape   . Latex Itching and Rash    Objective:   General:  Alert and oriented x 3, in no acute distress  Dermatology: Skin is warm, dry and supple bilateral lower extremities. Nails 1-10 are normal. There is no erythema, edema, no eccymosis, no open lesions present. Integument is otherwise unremarkable.  Vascular: Dorsalis Pedis pulse and Posterior Tibial pulse are 1/4 bilateral. Capillary fill time is <3 secs with varicosities bilateral.  Neurological: Grossly intact to light touch with an achilles reflex of +2/5 and a negative Tinel's sign bilateral.  Musculoskeletal: No tenderness to palpation at the medial calcaneal tubercale and through the insertion of the plantar fascia on the left foot however there is subjective tightness.   No tenderness midarch at plantar fascia on right, very minimal pain with palpation to lateral ankle along the peroneal tendon course there is no current pain the cuboid joint with a palpable bone spur on the left.  There is fat pad atrophy bilateral. No  pain with compression of calcaneus bilateral. No pain with tuning fork to calcaneus bilateral. No pain with calf compression bilateral. There is decreased Ankle joint range of motion bilateral. All other joints range of motion within normal limits bilateral. Strength 5/5 in all groups bilateral. Pes cavus bilateral.   Assessment and Plan: Problem List Items Addressed This Visit    None    Visit Diagnoses    Tear of tendon of left ankle, subsequent encounter    -  Primary   Plantar fasciitis of  left foot       Plantar fasciitis of right foot       PVD (peripheral vascular disease) (HCC)       Foot pain, bilateral         -Complete examination performed.  Prosthetic eval performed. -Patient to continue with using AFO on left and orthotic on right -Recommend continue with good supportive shoes daily and gentle stretching exercises -Continue with using Aspercreme and we will closely monitor tingling and if progresses will do a further work-up to evaluate for further condition of neuropathy since patient has a history of radiculopathy -Advised patient that she can continue with swimming and may consider massage therapy or reflexology as desired -Patient to return to office as needed or sooner if problems or issues arise.  Landis Martins, DPM

## 2017-06-22 ENCOUNTER — Other Ambulatory Visit: Payer: Self-pay | Admitting: Allergy and Immunology

## 2017-06-22 NOTE — Telephone Encounter (Signed)
Courtesy refill  

## 2017-07-04 ENCOUNTER — Encounter: Payer: Self-pay | Admitting: Allergy and Immunology

## 2017-07-04 ENCOUNTER — Ambulatory Visit: Payer: Medicare HMO | Admitting: Allergy and Immunology

## 2017-07-04 VITALS — BP 118/72 | HR 80 | Resp 20

## 2017-07-04 DIAGNOSIS — K219 Gastro-esophageal reflux disease without esophagitis: Secondary | ICD-10-CM | POA: Diagnosis not present

## 2017-07-04 DIAGNOSIS — L718 Other rosacea: Secondary | ICD-10-CM

## 2017-07-04 DIAGNOSIS — J3089 Other allergic rhinitis: Secondary | ICD-10-CM

## 2017-07-04 NOTE — Progress Notes (Signed)
Follow-up Note  Referring Provider: Ronita Hipps, MD Primary Provider: Ronita Hipps, MD Date of Office Visit: 07/04/2017  Subjective:   Stacey Patterson (DOB: Nov 08, 1940) is a 77 y.o. female who returns to the [facility] [place] on 07/04/2017 in re-evaluation of the following:  HPI: Stacey Patterson presents to this clinic in evaluation of allergic rhinitis and a component of ocular rosacea and LPR.  I have not seen her in this clinic since 31 March 2017.  Overall she has done very well regarding her airway and her eye.  While consistently using therapy directed against respiratory tract inflammation and consistent use of doxycycline she has had very little issue with nasal congestion or sneezing or head fullness or issues with itchy burning eye.  She has noticed that her vision has changed however over the course of the past several months.  She has no problems with distance vision but she is having some problems reading.  It has been over 1 year since she has visited with her eye doctor.  Her reflux is under excellent control at this point in time.  Stacey Patterson does mention to me that when she mows the grass she does get congested in her nose.  She has a difficult time using a mask because he gets so hot inside her mask and she ends up making the mask so loose that material gets around the mask and she ends up inhaling this material.  Allergies as of 07/04/2017      Reactions   Clarithromycin    Naproxen    Tape    Latex Itching, Rash      Medication List      doxycycline 100 MG tablet Commonly known as:  ADOXA TAKE 1 TABLET BY MOUTH EVERY DAY   HAIR SKIN NAILS PO Take by mouth.   metroNIDAZOLE 0.75 % cream Commonly known as:  METROCREAM Apply to face twice daily as directed.   NASACORT ALLERGY 24HR NA Place 1 spray into the nose. 3 times weekly   pantoprazole 40 MG tablet Commonly known as:  PROTONIX TAKE 1 TABLET BY MOUTH EVERY DAY IN THE MORNING   ranitidine 300 MG  tablet Commonly known as:  ZANTAC Take 1 tablet (300 mg total) by mouth at bedtime.   SYSTANE OP Apply to eye.   valsartan-hydrochlorothiazide 320-12.5 MG tablet Commonly known as:  DIOVAN-HCT Take 1 tablet by mouth daily.       Past Medical History:  Diagnosis Date  . Allergy   . ANXIETY   . ARTHRITIS   . Arthritis    SHOULDERS  . Asthma    AS A CHILD  . Hiatal hernia   . HYPERLIPIDEMIA   . HYPERTENSION     Past Surgical History:  Procedure Laterality Date  . ABDOMINAL HYSTERECTOMY    . APPENDECTOMY    . CARPAL TUNNEL RELEASE    . COLONOSCOPY    . LUMBAR DISC SURGERY    . TONSILLECTOMY      Review of systems negative except as noted in HPI / PMHx or noted below:  Review of Systems  Constitutional: Negative.   HENT: Negative.   Eyes: Negative.   Respiratory: Negative.   Cardiovascular: Negative.   Gastrointestinal: Negative.   Genitourinary: Negative.   Musculoskeletal: Negative.   Skin: Negative.   Neurological: Negative.   Endo/Heme/Allergies: Negative.   Psychiatric/Behavioral: Negative.      Objective:   Vitals:   07/04/17 1034  BP: 118/72  Pulse:  80  Resp: 20  SpO2: 98%          Physical Exam  HENT:  Head: Normocephalic.  Right Ear: Tympanic membrane, external ear and ear canal normal.  Left Ear: Tympanic membrane, external ear and ear canal normal.  Nose: Nose normal. No mucosal edema or rhinorrhea.  Mouth/Throat: Uvula is midline, oropharynx is clear and moist and mucous membranes are normal. No oropharyngeal exudate.  Eyes: Conjunctivae are normal.  Neck: Trachea normal. No tracheal tenderness present. No tracheal deviation present. No thyromegaly present.  Cardiovascular: Normal rate, regular rhythm, S1 normal, S2 normal and normal heart sounds.  No murmur heard. Pulmonary/Chest: Breath sounds normal. No stridor. No respiratory distress. She has no wheezes. She has no rales.  Musculoskeletal: She exhibits no edema.    Lymphadenopathy:       Head (right side): No tonsillar adenopathy present.       Head (left side): No tonsillar adenopathy present.    She has no cervical adenopathy.  Neurological: She is alert.  Skin: No rash noted. She is not diaphoretic. No erythema. Nails show no clubbing.    Diagnostics: none    Assessment and Plan:   1. Other allergic rhinitis   2. Ocular rosacea   3. LPRD (laryngopharyngeal reflux disease)     1.  Continue to Treat and prevent inflammation:   A.  OTC Nasacort 1 spray each nostril 3 times per week  2.  Continue to Treat and prevent reflux:   A.  Consolidate all chocolate consumption  B.  Pantoprazole 40 mg tablet in a.m.  C.  Ranitidine 300 mg tablet in p.m. - can attempt to stop  3.  Continue to Treat ocular rosacea:   A.  Doxycycline 100 mg tablet 1 time per day  B.  MetroCream applied to face twice a day  C.  Do not use any oral antihistamines  4.  If needed:   A.  Nasal saline  B.  OTC Systane eyedrops  5. Respirator while mowing  6.  Return to clinic in winter 2019 or earlier if problem  7. Obtain fall flu vaccine  8. Visit with eye doctor  Overall Stacey Patterson appears to be doing quite well and she has an opportunity to consolidate some of her treatment.  I informed her that she can attempt to discontinue her ranitidine at this point in time.  As well, I recommended that she use a respirator instead of a standard mask while mowing the grass.  She needs to visit with her eye doctor as it is been over a year since she has had a evaluation of her visual acuity and it does appear to have changed over the course of the past year.  I will see her back in this clinic in the winter 2019 or earlier if there is a problem.  [name], MD Allergy / Immunology [place]

## 2017-07-04 NOTE — Patient Instructions (Addendum)
  1.  Continue to Treat and prevent inflammation:   A.  OTC Nasacort 1 spray each nostril 3 times per week  2.  Continue to Treat and prevent reflux:   A.  Consolidate all chocolate consumption  B.  Pantoprazole 40 mg tablet in a.m.  C.  Ranitidine 300 mg tablet in p.m. - can attempt to stop  3.  Continue to Treat ocular rosacea:   A.  Doxycycline 100 mg tablet 1 time per day  B.  MetroCream applied to face twice a day  C.  Do not use any oral antihistamines  4.  If needed:   A.  Nasal saline  B.  OTC Systane eyedrops  5. Respirator while mowing  6.  Return to clinic in winter 2019 or earlier if problem  7. Obtain fall flu vaccine  8. Visit with eye doctor

## 2017-07-05 ENCOUNTER — Encounter: Payer: Self-pay | Admitting: Allergy and Immunology

## 2017-07-21 DIAGNOSIS — J984 Other disorders of lung: Secondary | ICD-10-CM | POA: Diagnosis not present

## 2017-07-21 DIAGNOSIS — R079 Chest pain, unspecified: Secondary | ICD-10-CM | POA: Diagnosis not present

## 2017-07-21 DIAGNOSIS — M542 Cervicalgia: Secondary | ICD-10-CM | POA: Diagnosis not present

## 2017-07-21 DIAGNOSIS — S299XXA Unspecified injury of thorax, initial encounter: Secondary | ICD-10-CM | POA: Diagnosis not present

## 2017-07-21 DIAGNOSIS — S199XXA Unspecified injury of neck, initial encounter: Secondary | ICD-10-CM | POA: Diagnosis not present

## 2017-07-21 DIAGNOSIS — I700 Atherosclerosis of aorta: Secondary | ICD-10-CM | POA: Diagnosis not present

## 2017-07-21 DIAGNOSIS — M5489 Other dorsalgia: Secondary | ICD-10-CM | POA: Diagnosis not present

## 2017-07-21 DIAGNOSIS — S0003XA Contusion of scalp, initial encounter: Secondary | ICD-10-CM | POA: Diagnosis not present

## 2017-07-21 DIAGNOSIS — S20219A Contusion of unspecified front wall of thorax, initial encounter: Secondary | ICD-10-CM | POA: Diagnosis not present

## 2017-07-21 DIAGNOSIS — S3991XA Unspecified injury of abdomen, initial encounter: Secondary | ICD-10-CM | POA: Diagnosis not present

## 2017-07-21 DIAGNOSIS — K760 Fatty (change of) liver, not elsewhere classified: Secondary | ICD-10-CM | POA: Diagnosis not present

## 2017-07-21 DIAGNOSIS — M25512 Pain in left shoulder: Secondary | ICD-10-CM | POA: Diagnosis not present

## 2017-07-21 DIAGNOSIS — M25511 Pain in right shoulder: Secondary | ICD-10-CM | POA: Diagnosis not present

## 2017-07-21 DIAGNOSIS — I10 Essential (primary) hypertension: Secondary | ICD-10-CM | POA: Diagnosis not present

## 2017-07-21 DIAGNOSIS — K769 Liver disease, unspecified: Secondary | ICD-10-CM | POA: Diagnosis not present

## 2017-07-21 DIAGNOSIS — S0990XA Unspecified injury of head, initial encounter: Secondary | ICD-10-CM | POA: Diagnosis not present

## 2017-07-28 DIAGNOSIS — J329 Chronic sinusitis, unspecified: Secondary | ICD-10-CM | POA: Diagnosis not present

## 2017-07-28 DIAGNOSIS — Z9181 History of falling: Secondary | ICD-10-CM | POA: Diagnosis not present

## 2017-07-28 DIAGNOSIS — Z1339 Encounter for screening examination for other mental health and behavioral disorders: Secondary | ICD-10-CM | POA: Diagnosis not present

## 2017-07-28 DIAGNOSIS — D649 Anemia, unspecified: Secondary | ICD-10-CM | POA: Diagnosis not present

## 2017-07-28 DIAGNOSIS — Z1331 Encounter for screening for depression: Secondary | ICD-10-CM | POA: Diagnosis not present

## 2017-07-28 DIAGNOSIS — Z6828 Body mass index (BMI) 28.0-28.9, adult: Secondary | ICD-10-CM | POA: Diagnosis not present

## 2017-07-28 DIAGNOSIS — D519 Vitamin B12 deficiency anemia, unspecified: Secondary | ICD-10-CM | POA: Diagnosis not present

## 2017-08-22 DIAGNOSIS — Z01419 Encounter for gynecological examination (general) (routine) without abnormal findings: Secondary | ICD-10-CM | POA: Diagnosis not present

## 2017-08-22 DIAGNOSIS — Z1231 Encounter for screening mammogram for malignant neoplasm of breast: Secondary | ICD-10-CM | POA: Diagnosis not present

## 2017-09-16 ENCOUNTER — Other Ambulatory Visit: Payer: Self-pay | Admitting: Allergy and Immunology

## 2017-09-16 ENCOUNTER — Other Ambulatory Visit: Payer: Self-pay | Admitting: *Deleted

## 2017-09-16 MED ORDER — PANTOPRAZOLE SODIUM 40 MG PO TBEC: DELAYED_RELEASE_TABLET | ORAL | 1 refills | Status: DC

## 2017-10-21 DIAGNOSIS — Z2821 Immunization not carried out because of patient refusal: Secondary | ICD-10-CM | POA: Diagnosis not present

## 2017-10-21 DIAGNOSIS — Z6829 Body mass index (BMI) 29.0-29.9, adult: Secondary | ICD-10-CM | POA: Diagnosis not present

## 2017-10-21 DIAGNOSIS — R109 Unspecified abdominal pain: Secondary | ICD-10-CM | POA: Diagnosis not present

## 2017-10-28 DIAGNOSIS — I700 Atherosclerosis of aorta: Secondary | ICD-10-CM | POA: Diagnosis not present

## 2017-10-28 DIAGNOSIS — K760 Fatty (change of) liver, not elsewhere classified: Secondary | ICD-10-CM | POA: Diagnosis not present

## 2017-10-28 DIAGNOSIS — R109 Unspecified abdominal pain: Secondary | ICD-10-CM | POA: Diagnosis not present

## 2017-11-23 ENCOUNTER — Encounter: Payer: Self-pay | Admitting: Sports Medicine

## 2017-11-23 NOTE — Progress Notes (Signed)
Patient discussed with medical assistant. Agree with her note. Patient to follow up as scheduled for continued care or sooner if problems or issues arise. -Dr. Jamille Yoshino  

## 2017-11-28 DIAGNOSIS — J329 Chronic sinusitis, unspecified: Secondary | ICD-10-CM | POA: Diagnosis not present

## 2017-11-28 DIAGNOSIS — Z6829 Body mass index (BMI) 29.0-29.9, adult: Secondary | ICD-10-CM | POA: Diagnosis not present

## 2017-12-22 DIAGNOSIS — Z6829 Body mass index (BMI) 29.0-29.9, adult: Secondary | ICD-10-CM | POA: Diagnosis not present

## 2017-12-22 DIAGNOSIS — Z0000 Encounter for general adult medical examination without abnormal findings: Secondary | ICD-10-CM | POA: Diagnosis not present

## 2017-12-22 DIAGNOSIS — M5442 Lumbago with sciatica, left side: Secondary | ICD-10-CM | POA: Diagnosis not present

## 2018-01-02 ENCOUNTER — Encounter: Payer: Self-pay | Admitting: Allergy and Immunology

## 2018-01-02 ENCOUNTER — Ambulatory Visit: Payer: Medicare HMO | Admitting: Allergy and Immunology

## 2018-01-02 VITALS — BP 132/72 | HR 72 | Resp 16

## 2018-01-02 DIAGNOSIS — L718 Other rosacea: Secondary | ICD-10-CM

## 2018-01-02 DIAGNOSIS — K219 Gastro-esophageal reflux disease without esophagitis: Secondary | ICD-10-CM

## 2018-01-02 DIAGNOSIS — J3089 Other allergic rhinitis: Secondary | ICD-10-CM | POA: Diagnosis not present

## 2018-01-02 MED ORDER — FAMOTIDINE 40 MG PO TABS: 40.0000 mg | ORAL_TABLET | Freq: Every day | ORAL | 5 refills | Status: DC

## 2018-01-02 MED ORDER — AZELASTINE HCL 0.15 % NA SOLN: NASAL | 5 refills | Status: DC

## 2018-01-02 NOTE — Progress Notes (Signed)
Follow-up Note  Referring Provider: Ronita Hipps, MD Primary Provider: Ronita Hipps, MD Date of Office Visit: 01/02/2018  Subjective:   Stacey Patterson (DOB: 07/06/1940) is a 77 y.o. female who returns to the [facility] [place] on 01/02/2018 in re-evaluation of the following:  HPI: [name] presents to this clinic in reevaluation of her allergic rhinitis and ocular rosacea and dry eye syndrome and LPR.  Her last visit to this clinic was 04 July 2017.  Over the course of the past month or so she has been having little bit more problems with intermittent nasal congestion and some drainage in her throat.  It should be noted that this increased drainage in her throat may have correlated with discontinuation of ranitidine in the treatment of her reflux disease.  As well, it should be noted that when she took prednisone for a sciatic issue this Christmas season she had resolution of most of her congestion and postnasal drip.  She has discontinued her doxycycline for ideological issues.  She has not really noticed much problem with her eyes however her eyes are always dry and she must wake up in the morning and put a hot towel on her eyes to soothe them from this achy feeling that she has.  She has not visited with an eye doctor yet.  She has not been having any problems with reflux at this point.  Allergies as of 01/02/2018      Reactions   Clarithromycin    Naproxen    Tape    Latex Itching, Rash      Medication List      doxycycline 100 MG tablet Commonly known as:  ADOXA TAKE 1 TABLET BY MOUTH EVERY DAY   HAIR SKIN NAILS PO Take by mouth.   metroNIDAZOLE 0.75 % cream Commonly known as:  METROCREAM Apply to face twice daily as directed.   NASACORT ALLERGY 24HR NA Place 1 spray into the nose. 3 times weekly   pantoprazole 40 MG tablet Commonly known as:  PROTONIX TAKE 1 TABLET BY MOUTH EVERY DAY IN THE MORNING   SYSTANE OP Apply to eye.     valsartan-hydrochlorothiazide 320-12.5 MG tablet Commonly known as:  DIOVAN-HCT Take 1 tablet by mouth daily.       Past Medical History:  Diagnosis Date  . Allergy   . ANXIETY   . ARTHRITIS   . Arthritis    SHOULDERS  . Asthma    AS A CHILD  . Hiatal hernia   . HYPERLIPIDEMIA   . HYPERTENSION     Past Surgical History:  Procedure Laterality Date  . ABDOMINAL HYSTERECTOMY    . APPENDECTOMY    . CARPAL TUNNEL RELEASE    . COLONOSCOPY    . LUMBAR DISC SURGERY    . TONSILLECTOMY      Review of systems negative except as noted in HPI / PMHx or noted below:  Review of Systems  Constitutional: Negative.   HENT: Negative.   Eyes: Negative.   Respiratory: Negative.   Cardiovascular: Negative.   Gastrointestinal: Negative.   Genitourinary: Negative.   Musculoskeletal: Negative.   Skin: Negative.   Neurological: Negative.   Endo/Heme/Allergies: Negative.   Psychiatric/Behavioral: Negative.      Objective:   Vitals:   01/02/18 1122 01/02/18 1133  BP: (!) 152/88 132/72  Pulse: 72   Resp: 16   SpO2: 98%           Physical Exam Constitutional:  Appearance: She is not diaphoretic.  HENT:     Head: Normocephalic.     Right Ear: Tympanic membrane, ear canal and external ear normal.     Left Ear: Tympanic membrane, ear canal and external ear normal.     Nose: Nose normal. No mucosal edema or rhinorrhea.     Mouth/Throat:     Pharynx: Uvula midline. No oropharyngeal exudate.  Eyes:     Conjunctiva/sclera: Conjunctivae normal.  Neck:     Thyroid: No thyromegaly.     Trachea: Trachea normal. No tracheal tenderness or tracheal deviation.  Cardiovascular:     Rate and Rhythm: Normal rate and regular rhythm.     Heart sounds: Normal heart sounds, S1 normal and S2 normal. No murmur.  Pulmonary:     Effort: No respiratory distress.     Breath sounds: Normal breath sounds. No stridor. No wheezing or rales.  Lymphadenopathy:     Head:     Right side of  head: No tonsillar adenopathy.     Left side of head: No tonsillar adenopathy.     Cervical: No cervical adenopathy.  Skin:    Findings: Rash (Facial erythema and induration forehead cheeks) present. No erythema.     Nails: There is no clubbing.   Neurological:     Mental Status: She is alert.     Diagnostics: none  Assessment and Plan:   1. Other allergic rhinitis   2. Ocular rosacea   3. LPRD (laryngopharyngeal reflux disease)     1.  Continue to Treat and prevent inflammation:   A.  OTC Nasacort 1 spray each nostril 3 times per week  2.  Continue to Treat and prevent reflux:   A.  Pantoprazole 40 mg tablet in a.m.  B.  Famotidine 40 mg tablet in p.m.  3.  Continue to Treat ocular rosacea:   A.  Can restart doxycycline 100 mg tablet 1 time per day  B.  MetroCream applied to face twice a day  C.  Do not use any oral antihistamines  4.  If needed:   A.  Nasal saline  B.  OTC Systane eyedrops  D. Azelastine -1-2 sprays each nostril 1-2 times a day  5.  Obtain a annual eye exam  6.  Return to clinic in summer 2020 or earlier if problem  [name] has a collection of inflammatory conditions affecting her airway and her skin and I have had a talk with her today about these issues and how to utilize anti-inflammatory agents for each issue.  She has an ideological problem using medications in general.  I had a talk with her today about the role of using a nasal steroid and therapy directed against reflux and therapy directed against cutaneous inflammation and she has the option of utilizing medications should she desire to address these inflammatory conditions.  I also made the recommendation that she obtain an annual eye exam and as it is been over a year and a half since her last exam.  I will see her back in this clinic in the summer 2020 or earlier if there is a problem.  [name], MD Allergy / Immunology [place]

## 2018-01-02 NOTE — Patient Instructions (Addendum)
  1.  Continue to Treat and prevent inflammation:   A.  OTC Nasacort 1 spray each nostril 3 times per week  2.  Continue to Treat and prevent reflux:   A.  Pantoprazole 40 mg tablet in a.m.  B.  Famotidine 40 mg tablet in p.m.  3.  Continue to Treat ocular rosacea:   A.  Can restart doxycycline 100 mg tablet 1 time per day  B.  MetroCream applied to face twice a day  C.  Do not use any oral antihistamines  4.  If needed:   A.  Nasal saline  B.  OTC Systane eyedrops  D. Azelastine -1-2 sprays each nostril 1-2 times a day  5.  Obtain a annual eye exam  6.  Return to clinic in summer 2020 or earlier if problem

## 2018-01-03 ENCOUNTER — Encounter: Payer: Self-pay | Admitting: Allergy and Immunology

## 2018-01-10 DIAGNOSIS — M5442 Lumbago with sciatica, left side: Secondary | ICD-10-CM | POA: Diagnosis not present

## 2018-01-17 DIAGNOSIS — L578 Other skin changes due to chronic exposure to nonionizing radiation: Secondary | ICD-10-CM | POA: Diagnosis not present

## 2018-01-17 DIAGNOSIS — L821 Other seborrheic keratosis: Secondary | ICD-10-CM | POA: Diagnosis not present

## 2018-01-17 DIAGNOSIS — D485 Neoplasm of uncertain behavior of skin: Secondary | ICD-10-CM | POA: Diagnosis not present

## 2018-02-01 DIAGNOSIS — M25532 Pain in left wrist: Secondary | ICD-10-CM | POA: Diagnosis not present

## 2018-03-06 DIAGNOSIS — Z6829 Body mass index (BMI) 29.0-29.9, adult: Secondary | ICD-10-CM | POA: Diagnosis not present

## 2018-03-06 DIAGNOSIS — M2550 Pain in unspecified joint: Secondary | ICD-10-CM | POA: Diagnosis not present

## 2018-03-06 DIAGNOSIS — I10 Essential (primary) hypertension: Secondary | ICD-10-CM | POA: Diagnosis not present

## 2018-03-06 DIAGNOSIS — Z79899 Other long term (current) drug therapy: Secondary | ICD-10-CM | POA: Diagnosis not present

## 2018-03-14 DIAGNOSIS — Z6830 Body mass index (BMI) 30.0-30.9, adult: Secondary | ICD-10-CM | POA: Diagnosis not present

## 2018-03-14 DIAGNOSIS — J329 Chronic sinusitis, unspecified: Secondary | ICD-10-CM | POA: Diagnosis not present

## 2018-03-15 ENCOUNTER — Other Ambulatory Visit: Payer: Self-pay | Admitting: *Deleted

## 2018-03-15 MED ORDER — PANTOPRAZOLE SODIUM 40 MG PO TBEC: DELAYED_RELEASE_TABLET | ORAL | 0 refills | Status: DC

## 2018-05-19 DIAGNOSIS — H02831 Dermatochalasis of right upper eyelid: Secondary | ICD-10-CM | POA: Diagnosis not present

## 2018-06-21 DIAGNOSIS — H02831 Dermatochalasis of right upper eyelid: Secondary | ICD-10-CM | POA: Diagnosis not present

## 2018-07-03 ENCOUNTER — Ambulatory Visit: Payer: Medicare HMO | Admitting: Allergy and Immunology

## 2018-07-18 DIAGNOSIS — R69 Illness, unspecified: Secondary | ICD-10-CM | POA: Diagnosis not present

## 2018-07-28 DIAGNOSIS — H02834 Dermatochalasis of left upper eyelid: Secondary | ICD-10-CM | POA: Diagnosis not present

## 2018-07-28 DIAGNOSIS — H02831 Dermatochalasis of right upper eyelid: Secondary | ICD-10-CM | POA: Diagnosis not present

## 2018-08-09 DIAGNOSIS — S80211A Abrasion, right knee, initial encounter: Secondary | ICD-10-CM | POA: Diagnosis not present

## 2018-08-09 DIAGNOSIS — S8002XA Contusion of left knee, initial encounter: Secondary | ICD-10-CM | POA: Diagnosis not present

## 2018-08-09 DIAGNOSIS — Z6830 Body mass index (BMI) 30.0-30.9, adult: Secondary | ICD-10-CM | POA: Diagnosis not present

## 2018-08-09 DIAGNOSIS — M25561 Pain in right knee: Secondary | ICD-10-CM | POA: Diagnosis not present

## 2018-08-09 DIAGNOSIS — M25562 Pain in left knee: Secondary | ICD-10-CM | POA: Diagnosis not present

## 2018-08-09 DIAGNOSIS — W19XXXA Unspecified fall, initial encounter: Secondary | ICD-10-CM | POA: Diagnosis not present

## 2018-08-17 DIAGNOSIS — S8002XA Contusion of left knee, initial encounter: Secondary | ICD-10-CM | POA: Diagnosis not present

## 2018-08-17 DIAGNOSIS — S8012XA Contusion of left lower leg, initial encounter: Secondary | ICD-10-CM | POA: Diagnosis not present

## 2018-08-31 DIAGNOSIS — Z6830 Body mass index (BMI) 30.0-30.9, adult: Secondary | ICD-10-CM | POA: Diagnosis not present

## 2018-08-31 DIAGNOSIS — M6788 Other specified disorders of synovium and tendon, other site: Secondary | ICD-10-CM | POA: Diagnosis not present

## 2018-09-20 DIAGNOSIS — M7712 Lateral epicondylitis, left elbow: Secondary | ICD-10-CM | POA: Diagnosis not present

## 2018-10-10 ENCOUNTER — Encounter: Payer: Self-pay | Admitting: Gastroenterology

## 2019-01-15 DIAGNOSIS — M25529 Pain in unspecified elbow: Secondary | ICD-10-CM | POA: Insufficient documentation

## 2019-01-15 DIAGNOSIS — M25522 Pain in left elbow: Secondary | ICD-10-CM | POA: Diagnosis not present

## 2019-01-22 DIAGNOSIS — L578 Other skin changes due to chronic exposure to nonionizing radiation: Secondary | ICD-10-CM | POA: Diagnosis not present

## 2019-01-22 DIAGNOSIS — D0461 Carcinoma in situ of skin of right upper limb, including shoulder: Secondary | ICD-10-CM | POA: Diagnosis not present

## 2019-01-22 DIAGNOSIS — L821 Other seborrheic keratosis: Secondary | ICD-10-CM | POA: Diagnosis not present

## 2019-01-22 DIAGNOSIS — L820 Inflamed seborrheic keratosis: Secondary | ICD-10-CM | POA: Diagnosis not present

## 2019-01-25 DIAGNOSIS — R69 Illness, unspecified: Secondary | ICD-10-CM | POA: Diagnosis not present

## 2019-03-10 ENCOUNTER — Other Ambulatory Visit: Payer: Self-pay | Admitting: Allergy and Immunology

## 2019-03-13 DIAGNOSIS — R69 Illness, unspecified: Secondary | ICD-10-CM | POA: Diagnosis not present

## 2019-03-16 DIAGNOSIS — Z6830 Body mass index (BMI) 30.0-30.9, adult: Secondary | ICD-10-CM | POA: Diagnosis not present

## 2019-03-16 DIAGNOSIS — Z1331 Encounter for screening for depression: Secondary | ICD-10-CM | POA: Diagnosis not present

## 2019-03-16 DIAGNOSIS — Z6832 Body mass index (BMI) 32.0-32.9, adult: Secondary | ICD-10-CM | POA: Diagnosis not present

## 2019-03-16 DIAGNOSIS — Z79899 Other long term (current) drug therapy: Secondary | ICD-10-CM | POA: Diagnosis not present

## 2019-03-16 DIAGNOSIS — K219 Gastro-esophageal reflux disease without esophagitis: Secondary | ICD-10-CM | POA: Diagnosis not present

## 2019-03-16 DIAGNOSIS — E7800 Pure hypercholesterolemia, unspecified: Secondary | ICD-10-CM | POA: Diagnosis not present

## 2019-03-16 DIAGNOSIS — Z0000 Encounter for general adult medical examination without abnormal findings: Secondary | ICD-10-CM | POA: Diagnosis not present

## 2019-03-16 DIAGNOSIS — I10 Essential (primary) hypertension: Secondary | ICD-10-CM | POA: Diagnosis not present

## 2019-03-28 DIAGNOSIS — M25522 Pain in left elbow: Secondary | ICD-10-CM | POA: Diagnosis not present

## 2019-03-28 DIAGNOSIS — M5412 Radiculopathy, cervical region: Secondary | ICD-10-CM | POA: Insufficient documentation

## 2019-03-28 DIAGNOSIS — M7502 Adhesive capsulitis of left shoulder: Secondary | ICD-10-CM | POA: Diagnosis not present

## 2019-03-28 DIAGNOSIS — M7542 Impingement syndrome of left shoulder: Secondary | ICD-10-CM | POA: Diagnosis not present

## 2019-04-25 DIAGNOSIS — G5632 Lesion of radial nerve, left upper limb: Secondary | ICD-10-CM | POA: Diagnosis not present

## 2019-04-25 DIAGNOSIS — M25512 Pain in left shoulder: Secondary | ICD-10-CM | POA: Diagnosis not present

## 2019-04-25 DIAGNOSIS — M7502 Adhesive capsulitis of left shoulder: Secondary | ICD-10-CM | POA: Diagnosis not present

## 2019-05-11 DIAGNOSIS — M7918 Myalgia, other site: Secondary | ICD-10-CM | POA: Diagnosis not present

## 2019-05-11 DIAGNOSIS — M25561 Pain in right knee: Secondary | ICD-10-CM | POA: Diagnosis not present

## 2019-05-11 DIAGNOSIS — M25551 Pain in right hip: Secondary | ICD-10-CM | POA: Diagnosis not present

## 2019-05-11 DIAGNOSIS — W19XXXA Unspecified fall, initial encounter: Secondary | ICD-10-CM | POA: Diagnosis not present

## 2019-05-21 DIAGNOSIS — H524 Presbyopia: Secondary | ICD-10-CM | POA: Diagnosis not present

## 2019-05-23 DIAGNOSIS — G5632 Lesion of radial nerve, left upper limb: Secondary | ICD-10-CM | POA: Diagnosis not present

## 2019-05-23 DIAGNOSIS — G5630 Lesion of radial nerve, unspecified upper limb: Secondary | ICD-10-CM | POA: Insufficient documentation

## 2019-05-23 DIAGNOSIS — M25519 Pain in unspecified shoulder: Secondary | ICD-10-CM | POA: Diagnosis not present

## 2019-05-23 DIAGNOSIS — M7502 Adhesive capsulitis of left shoulder: Secondary | ICD-10-CM | POA: Diagnosis not present

## 2019-06-18 DIAGNOSIS — Z809 Family history of malignant neoplasm, unspecified: Secondary | ICD-10-CM | POA: Diagnosis not present

## 2019-06-18 DIAGNOSIS — I739 Peripheral vascular disease, unspecified: Secondary | ICD-10-CM | POA: Diagnosis not present

## 2019-06-18 DIAGNOSIS — K219 Gastro-esophageal reflux disease without esophagitis: Secondary | ICD-10-CM | POA: Diagnosis not present

## 2019-06-18 DIAGNOSIS — I10 Essential (primary) hypertension: Secondary | ICD-10-CM | POA: Diagnosis not present

## 2019-06-18 DIAGNOSIS — E669 Obesity, unspecified: Secondary | ICD-10-CM | POA: Diagnosis not present

## 2019-06-18 DIAGNOSIS — J309 Allergic rhinitis, unspecified: Secondary | ICD-10-CM | POA: Diagnosis not present

## 2019-06-18 DIAGNOSIS — Z823 Family history of stroke: Secondary | ICD-10-CM | POA: Diagnosis not present

## 2019-06-18 DIAGNOSIS — Z8249 Family history of ischemic heart disease and other diseases of the circulatory system: Secondary | ICD-10-CM | POA: Diagnosis not present

## 2019-06-18 DIAGNOSIS — H269 Unspecified cataract: Secondary | ICD-10-CM | POA: Diagnosis not present

## 2019-06-18 DIAGNOSIS — Z008 Encounter for other general examination: Secondary | ICD-10-CM | POA: Diagnosis not present

## 2019-06-18 DIAGNOSIS — R32 Unspecified urinary incontinence: Secondary | ICD-10-CM | POA: Diagnosis not present

## 2019-07-27 DIAGNOSIS — S60561A Insect bite (nonvenomous) of right hand, initial encounter: Secondary | ICD-10-CM | POA: Diagnosis not present

## 2019-08-02 DIAGNOSIS — R69 Illness, unspecified: Secondary | ICD-10-CM | POA: Diagnosis not present

## 2019-09-05 DIAGNOSIS — S61217A Laceration without foreign body of left little finger without damage to nail, initial encounter: Secondary | ICD-10-CM | POA: Diagnosis not present

## 2019-09-24 DIAGNOSIS — Z01419 Encounter for gynecological examination (general) (routine) without abnormal findings: Secondary | ICD-10-CM | POA: Diagnosis not present

## 2019-09-24 DIAGNOSIS — Z1231 Encounter for screening mammogram for malignant neoplasm of breast: Secondary | ICD-10-CM | POA: Diagnosis not present

## 2019-10-17 DIAGNOSIS — M25561 Pain in right knee: Secondary | ICD-10-CM | POA: Diagnosis not present

## 2019-10-17 DIAGNOSIS — M25562 Pain in left knee: Secondary | ICD-10-CM | POA: Diagnosis not present

## 2020-01-06 DIAGNOSIS — J0100 Acute maxillary sinusitis, unspecified: Secondary | ICD-10-CM | POA: Diagnosis not present

## 2020-02-03 DIAGNOSIS — I10 Essential (primary) hypertension: Secondary | ICD-10-CM | POA: Diagnosis not present

## 2020-02-03 DIAGNOSIS — E7800 Pure hypercholesterolemia, unspecified: Secondary | ICD-10-CM | POA: Diagnosis not present

## 2020-02-03 DIAGNOSIS — M2550 Pain in unspecified joint: Secondary | ICD-10-CM | POA: Diagnosis not present

## 2020-02-05 DIAGNOSIS — L578 Other skin changes due to chronic exposure to nonionizing radiation: Secondary | ICD-10-CM | POA: Diagnosis not present

## 2020-02-05 DIAGNOSIS — L821 Other seborrheic keratosis: Secondary | ICD-10-CM | POA: Diagnosis not present

## 2020-02-20 DIAGNOSIS — M25519 Pain in unspecified shoulder: Secondary | ICD-10-CM | POA: Diagnosis not present

## 2020-03-19 DIAGNOSIS — M7502 Adhesive capsulitis of left shoulder: Secondary | ICD-10-CM | POA: Diagnosis not present

## 2020-03-19 DIAGNOSIS — M7542 Impingement syndrome of left shoulder: Secondary | ICD-10-CM | POA: Diagnosis not present

## 2020-03-20 DIAGNOSIS — M7542 Impingement syndrome of left shoulder: Secondary | ICD-10-CM | POA: Insufficient documentation

## 2020-03-25 DIAGNOSIS — M6281 Muscle weakness (generalized): Secondary | ICD-10-CM | POA: Diagnosis not present

## 2020-03-25 DIAGNOSIS — M25612 Stiffness of left shoulder, not elsewhere classified: Secondary | ICD-10-CM | POA: Diagnosis not present

## 2020-03-25 DIAGNOSIS — M25512 Pain in left shoulder: Secondary | ICD-10-CM | POA: Diagnosis not present

## 2020-03-27 DIAGNOSIS — M25512 Pain in left shoulder: Secondary | ICD-10-CM | POA: Diagnosis not present

## 2020-03-27 DIAGNOSIS — M25612 Stiffness of left shoulder, not elsewhere classified: Secondary | ICD-10-CM | POA: Diagnosis not present

## 2020-03-27 DIAGNOSIS — M6281 Muscle weakness (generalized): Secondary | ICD-10-CM | POA: Diagnosis not present

## 2020-03-31 DIAGNOSIS — M6281 Muscle weakness (generalized): Secondary | ICD-10-CM | POA: Diagnosis not present

## 2020-03-31 DIAGNOSIS — M25612 Stiffness of left shoulder, not elsewhere classified: Secondary | ICD-10-CM | POA: Diagnosis not present

## 2020-03-31 DIAGNOSIS — M25512 Pain in left shoulder: Secondary | ICD-10-CM | POA: Diagnosis not present

## 2020-04-02 DIAGNOSIS — M6281 Muscle weakness (generalized): Secondary | ICD-10-CM | POA: Diagnosis not present

## 2020-04-02 DIAGNOSIS — M25512 Pain in left shoulder: Secondary | ICD-10-CM | POA: Diagnosis not present

## 2020-04-02 DIAGNOSIS — M25612 Stiffness of left shoulder, not elsewhere classified: Secondary | ICD-10-CM | POA: Diagnosis not present

## 2020-04-15 DIAGNOSIS — M25612 Stiffness of left shoulder, not elsewhere classified: Secondary | ICD-10-CM | POA: Diagnosis not present

## 2020-04-15 DIAGNOSIS — M25512 Pain in left shoulder: Secondary | ICD-10-CM | POA: Diagnosis not present

## 2020-04-15 DIAGNOSIS — M6281 Muscle weakness (generalized): Secondary | ICD-10-CM | POA: Diagnosis not present

## 2020-04-17 DIAGNOSIS — M6281 Muscle weakness (generalized): Secondary | ICD-10-CM | POA: Diagnosis not present

## 2020-04-17 DIAGNOSIS — M25612 Stiffness of left shoulder, not elsewhere classified: Secondary | ICD-10-CM | POA: Diagnosis not present

## 2020-04-17 DIAGNOSIS — M25512 Pain in left shoulder: Secondary | ICD-10-CM | POA: Diagnosis not present

## 2020-04-22 DIAGNOSIS — M25512 Pain in left shoulder: Secondary | ICD-10-CM | POA: Diagnosis not present

## 2020-04-22 DIAGNOSIS — M25612 Stiffness of left shoulder, not elsewhere classified: Secondary | ICD-10-CM | POA: Diagnosis not present

## 2020-04-22 DIAGNOSIS — M6281 Muscle weakness (generalized): Secondary | ICD-10-CM | POA: Diagnosis not present

## 2020-04-24 DIAGNOSIS — M25512 Pain in left shoulder: Secondary | ICD-10-CM | POA: Diagnosis not present

## 2020-04-24 DIAGNOSIS — M25612 Stiffness of left shoulder, not elsewhere classified: Secondary | ICD-10-CM | POA: Diagnosis not present

## 2020-04-24 DIAGNOSIS — M6281 Muscle weakness (generalized): Secondary | ICD-10-CM | POA: Diagnosis not present

## 2020-04-29 DIAGNOSIS — M25612 Stiffness of left shoulder, not elsewhere classified: Secondary | ICD-10-CM | POA: Diagnosis not present

## 2020-04-29 DIAGNOSIS — M25512 Pain in left shoulder: Secondary | ICD-10-CM | POA: Diagnosis not present

## 2020-04-29 DIAGNOSIS — M6281 Muscle weakness (generalized): Secondary | ICD-10-CM | POA: Diagnosis not present

## 2020-04-30 DIAGNOSIS — M7502 Adhesive capsulitis of left shoulder: Secondary | ICD-10-CM | POA: Diagnosis not present

## 2020-04-30 DIAGNOSIS — M7542 Impingement syndrome of left shoulder: Secondary | ICD-10-CM | POA: Diagnosis not present

## 2020-05-01 DIAGNOSIS — M6281 Muscle weakness (generalized): Secondary | ICD-10-CM | POA: Diagnosis not present

## 2020-05-01 DIAGNOSIS — M25512 Pain in left shoulder: Secondary | ICD-10-CM | POA: Diagnosis not present

## 2020-05-01 DIAGNOSIS — M25612 Stiffness of left shoulder, not elsewhere classified: Secondary | ICD-10-CM | POA: Diagnosis not present

## 2020-05-01 DIAGNOSIS — R69 Illness, unspecified: Secondary | ICD-10-CM | POA: Diagnosis not present

## 2020-05-01 DIAGNOSIS — G8929 Other chronic pain: Secondary | ICD-10-CM | POA: Diagnosis not present

## 2020-05-01 DIAGNOSIS — I10 Essential (primary) hypertension: Secondary | ICD-10-CM | POA: Diagnosis not present

## 2020-05-01 DIAGNOSIS — Z008 Encounter for other general examination: Secondary | ICD-10-CM | POA: Diagnosis not present

## 2020-05-01 DIAGNOSIS — I739 Peripheral vascular disease, unspecified: Secondary | ICD-10-CM | POA: Diagnosis not present

## 2020-05-01 DIAGNOSIS — K219 Gastro-esophageal reflux disease without esophagitis: Secondary | ICD-10-CM | POA: Diagnosis not present

## 2020-05-01 DIAGNOSIS — E785 Hyperlipidemia, unspecified: Secondary | ICD-10-CM | POA: Diagnosis not present

## 2020-05-01 DIAGNOSIS — E663 Overweight: Secondary | ICD-10-CM | POA: Diagnosis not present

## 2020-05-01 DIAGNOSIS — M5432 Sciatica, left side: Secondary | ICD-10-CM | POA: Diagnosis not present

## 2020-05-01 DIAGNOSIS — J302 Other seasonal allergic rhinitis: Secondary | ICD-10-CM | POA: Diagnosis not present

## 2020-05-01 DIAGNOSIS — M2550 Pain in unspecified joint: Secondary | ICD-10-CM | POA: Diagnosis not present

## 2020-05-05 DIAGNOSIS — Z6829 Body mass index (BMI) 29.0-29.9, adult: Secondary | ICD-10-CM | POA: Diagnosis not present

## 2020-05-05 DIAGNOSIS — K219 Gastro-esophageal reflux disease without esophagitis: Secondary | ICD-10-CM | POA: Diagnosis not present

## 2020-05-05 DIAGNOSIS — I10 Essential (primary) hypertension: Secondary | ICD-10-CM | POA: Diagnosis not present

## 2020-05-05 DIAGNOSIS — Z79899 Other long term (current) drug therapy: Secondary | ICD-10-CM | POA: Diagnosis not present

## 2020-05-05 DIAGNOSIS — M8580 Other specified disorders of bone density and structure, unspecified site: Secondary | ICD-10-CM | POA: Diagnosis not present

## 2020-05-05 DIAGNOSIS — Z9181 History of falling: Secondary | ICD-10-CM | POA: Diagnosis not present

## 2020-05-05 DIAGNOSIS — E7800 Pure hypercholesterolemia, unspecified: Secondary | ICD-10-CM | POA: Diagnosis not present

## 2020-05-06 DIAGNOSIS — M6281 Muscle weakness (generalized): Secondary | ICD-10-CM | POA: Diagnosis not present

## 2020-05-06 DIAGNOSIS — M25512 Pain in left shoulder: Secondary | ICD-10-CM | POA: Diagnosis not present

## 2020-05-06 DIAGNOSIS — M25612 Stiffness of left shoulder, not elsewhere classified: Secondary | ICD-10-CM | POA: Diagnosis not present

## 2020-05-08 DIAGNOSIS — M25512 Pain in left shoulder: Secondary | ICD-10-CM | POA: Diagnosis not present

## 2020-05-08 DIAGNOSIS — M6281 Muscle weakness (generalized): Secondary | ICD-10-CM | POA: Diagnosis not present

## 2020-05-08 DIAGNOSIS — M25612 Stiffness of left shoulder, not elsewhere classified: Secondary | ICD-10-CM | POA: Diagnosis not present

## 2020-05-13 DIAGNOSIS — M6281 Muscle weakness (generalized): Secondary | ICD-10-CM | POA: Diagnosis not present

## 2020-05-13 DIAGNOSIS — Z0000 Encounter for general adult medical examination without abnormal findings: Secondary | ICD-10-CM | POA: Diagnosis not present

## 2020-05-13 DIAGNOSIS — M25612 Stiffness of left shoulder, not elsewhere classified: Secondary | ICD-10-CM | POA: Diagnosis not present

## 2020-05-13 DIAGNOSIS — M25512 Pain in left shoulder: Secondary | ICD-10-CM | POA: Diagnosis not present

## 2020-05-15 DIAGNOSIS — M25512 Pain in left shoulder: Secondary | ICD-10-CM | POA: Diagnosis not present

## 2020-05-15 DIAGNOSIS — M25612 Stiffness of left shoulder, not elsewhere classified: Secondary | ICD-10-CM | POA: Diagnosis not present

## 2020-05-15 DIAGNOSIS — M6281 Muscle weakness (generalized): Secondary | ICD-10-CM | POA: Diagnosis not present

## 2020-05-20 DIAGNOSIS — M25612 Stiffness of left shoulder, not elsewhere classified: Secondary | ICD-10-CM | POA: Diagnosis not present

## 2020-05-20 DIAGNOSIS — M6281 Muscle weakness (generalized): Secondary | ICD-10-CM | POA: Diagnosis not present

## 2020-05-20 DIAGNOSIS — M25512 Pain in left shoulder: Secondary | ICD-10-CM | POA: Diagnosis not present

## 2020-05-22 DIAGNOSIS — H40033 Anatomical narrow angle, bilateral: Secondary | ICD-10-CM | POA: Diagnosis not present

## 2020-05-23 DIAGNOSIS — M6281 Muscle weakness (generalized): Secondary | ICD-10-CM | POA: Diagnosis not present

## 2020-05-23 DIAGNOSIS — M25512 Pain in left shoulder: Secondary | ICD-10-CM | POA: Diagnosis not present

## 2020-05-23 DIAGNOSIS — M25612 Stiffness of left shoulder, not elsewhere classified: Secondary | ICD-10-CM | POA: Diagnosis not present

## 2020-05-27 DIAGNOSIS — M25612 Stiffness of left shoulder, not elsewhere classified: Secondary | ICD-10-CM | POA: Diagnosis not present

## 2020-05-27 DIAGNOSIS — M6281 Muscle weakness (generalized): Secondary | ICD-10-CM | POA: Diagnosis not present

## 2020-05-27 DIAGNOSIS — M25512 Pain in left shoulder: Secondary | ICD-10-CM | POA: Diagnosis not present

## 2020-05-30 DIAGNOSIS — R519 Headache, unspecified: Secondary | ICD-10-CM | POA: Diagnosis not present

## 2020-05-30 DIAGNOSIS — M6281 Muscle weakness (generalized): Secondary | ICD-10-CM | POA: Diagnosis not present

## 2020-05-30 DIAGNOSIS — R0981 Nasal congestion: Secondary | ICD-10-CM | POA: Diagnosis not present

## 2020-05-30 DIAGNOSIS — J329 Chronic sinusitis, unspecified: Secondary | ICD-10-CM | POA: Diagnosis not present

## 2020-05-30 DIAGNOSIS — J3489 Other specified disorders of nose and nasal sinuses: Secondary | ICD-10-CM | POA: Diagnosis not present

## 2020-05-30 DIAGNOSIS — J342 Deviated nasal septum: Secondary | ICD-10-CM | POA: Diagnosis not present

## 2020-05-30 DIAGNOSIS — G8929 Other chronic pain: Secondary | ICD-10-CM | POA: Diagnosis not present

## 2020-05-30 DIAGNOSIS — M25612 Stiffness of left shoulder, not elsewhere classified: Secondary | ICD-10-CM | POA: Diagnosis not present

## 2020-05-30 DIAGNOSIS — Z87898 Personal history of other specified conditions: Secondary | ICD-10-CM | POA: Diagnosis not present

## 2020-05-30 DIAGNOSIS — J343 Hypertrophy of nasal turbinates: Secondary | ICD-10-CM | POA: Diagnosis not present

## 2020-05-30 DIAGNOSIS — M25512 Pain in left shoulder: Secondary | ICD-10-CM | POA: Diagnosis not present

## 2020-06-10 DIAGNOSIS — M25512 Pain in left shoulder: Secondary | ICD-10-CM | POA: Diagnosis not present

## 2020-06-10 DIAGNOSIS — M6281 Muscle weakness (generalized): Secondary | ICD-10-CM | POA: Diagnosis not present

## 2020-06-10 DIAGNOSIS — M25612 Stiffness of left shoulder, not elsewhere classified: Secondary | ICD-10-CM | POA: Diagnosis not present

## 2020-06-12 DIAGNOSIS — M25512 Pain in left shoulder: Secondary | ICD-10-CM | POA: Diagnosis not present

## 2020-06-12 DIAGNOSIS — M6281 Muscle weakness (generalized): Secondary | ICD-10-CM | POA: Diagnosis not present

## 2020-06-12 DIAGNOSIS — M25612 Stiffness of left shoulder, not elsewhere classified: Secondary | ICD-10-CM | POA: Diagnosis not present

## 2020-06-13 DIAGNOSIS — J323 Chronic sphenoidal sinusitis: Secondary | ICD-10-CM | POA: Diagnosis not present

## 2020-06-13 DIAGNOSIS — J320 Chronic maxillary sinusitis: Secondary | ICD-10-CM | POA: Diagnosis not present

## 2020-06-13 DIAGNOSIS — J329 Chronic sinusitis, unspecified: Secondary | ICD-10-CM | POA: Diagnosis not present

## 2020-06-17 DIAGNOSIS — M25612 Stiffness of left shoulder, not elsewhere classified: Secondary | ICD-10-CM | POA: Diagnosis not present

## 2020-06-17 DIAGNOSIS — M25512 Pain in left shoulder: Secondary | ICD-10-CM | POA: Diagnosis not present

## 2020-06-17 DIAGNOSIS — M6281 Muscle weakness (generalized): Secondary | ICD-10-CM | POA: Diagnosis not present

## 2020-06-20 DIAGNOSIS — R0981 Nasal congestion: Secondary | ICD-10-CM | POA: Diagnosis not present

## 2020-06-20 DIAGNOSIS — J342 Deviated nasal septum: Secondary | ICD-10-CM | POA: Diagnosis not present

## 2020-06-20 DIAGNOSIS — J320 Chronic maxillary sinusitis: Secondary | ICD-10-CM | POA: Diagnosis not present

## 2020-06-20 DIAGNOSIS — J343 Hypertrophy of nasal turbinates: Secondary | ICD-10-CM | POA: Diagnosis not present

## 2020-06-20 DIAGNOSIS — I10 Essential (primary) hypertension: Secondary | ICD-10-CM | POA: Diagnosis not present

## 2020-07-01 DIAGNOSIS — L03113 Cellulitis of right upper limb: Secondary | ICD-10-CM | POA: Diagnosis not present

## 2020-07-01 DIAGNOSIS — Z6827 Body mass index (BMI) 27.0-27.9, adult: Secondary | ICD-10-CM | POA: Diagnosis not present

## 2020-07-01 DIAGNOSIS — W57XXXA Bitten or stung by nonvenomous insect and other nonvenomous arthropods, initial encounter: Secondary | ICD-10-CM | POA: Diagnosis not present

## 2020-07-01 DIAGNOSIS — S40861A Insect bite (nonvenomous) of right upper arm, initial encounter: Secondary | ICD-10-CM | POA: Diagnosis not present

## 2020-07-21 DIAGNOSIS — I10 Essential (primary) hypertension: Secondary | ICD-10-CM | POA: Diagnosis not present

## 2020-07-21 DIAGNOSIS — J320 Chronic maxillary sinusitis: Secondary | ICD-10-CM | POA: Diagnosis not present

## 2020-07-21 DIAGNOSIS — J342 Deviated nasal septum: Secondary | ICD-10-CM | POA: Diagnosis not present

## 2020-07-21 DIAGNOSIS — J343 Hypertrophy of nasal turbinates: Secondary | ICD-10-CM | POA: Diagnosis not present

## 2020-07-21 DIAGNOSIS — R0981 Nasal congestion: Secondary | ICD-10-CM | POA: Diagnosis not present

## 2020-07-21 DIAGNOSIS — J3489 Other specified disorders of nose and nasal sinuses: Secondary | ICD-10-CM | POA: Diagnosis not present

## 2020-07-30 DIAGNOSIS — K219 Gastro-esophageal reflux disease without esophagitis: Secondary | ICD-10-CM | POA: Diagnosis not present

## 2020-07-30 DIAGNOSIS — R0981 Nasal congestion: Secondary | ICD-10-CM | POA: Diagnosis not present

## 2020-07-30 DIAGNOSIS — J320 Chronic maxillary sinusitis: Secondary | ICD-10-CM | POA: Diagnosis not present

## 2020-07-30 DIAGNOSIS — J342 Deviated nasal septum: Secondary | ICD-10-CM | POA: Diagnosis not present

## 2020-07-30 DIAGNOSIS — J339 Nasal polyp, unspecified: Secondary | ICD-10-CM | POA: Diagnosis not present

## 2020-07-30 DIAGNOSIS — J343 Hypertrophy of nasal turbinates: Secondary | ICD-10-CM | POA: Diagnosis not present

## 2020-07-30 DIAGNOSIS — J3489 Other specified disorders of nose and nasal sinuses: Secondary | ICD-10-CM | POA: Diagnosis not present

## 2020-07-30 DIAGNOSIS — I10 Essential (primary) hypertension: Secondary | ICD-10-CM | POA: Diagnosis not present

## 2020-07-30 DIAGNOSIS — I110 Hypertensive heart disease with heart failure: Secondary | ICD-10-CM | POA: Diagnosis not present

## 2020-07-31 DIAGNOSIS — Z09 Encounter for follow-up examination after completed treatment for conditions other than malignant neoplasm: Secondary | ICD-10-CM | POA: Diagnosis not present

## 2020-08-08 DIAGNOSIS — J329 Chronic sinusitis, unspecified: Secondary | ICD-10-CM | POA: Diagnosis not present

## 2020-08-15 DIAGNOSIS — J329 Chronic sinusitis, unspecified: Secondary | ICD-10-CM | POA: Diagnosis not present

## 2020-09-23 DIAGNOSIS — Z03818 Encounter for observation for suspected exposure to other biological agents ruled out: Secondary | ICD-10-CM | POA: Diagnosis not present

## 2020-09-23 DIAGNOSIS — U071 COVID-19: Secondary | ICD-10-CM | POA: Diagnosis not present

## 2020-09-26 DIAGNOSIS — U071 COVID-19: Secondary | ICD-10-CM | POA: Diagnosis not present

## 2020-10-13 DIAGNOSIS — Z1231 Encounter for screening mammogram for malignant neoplasm of breast: Secondary | ICD-10-CM | POA: Diagnosis not present

## 2020-10-27 DIAGNOSIS — L638 Other alopecia areata: Secondary | ICD-10-CM | POA: Diagnosis not present

## 2020-10-27 DIAGNOSIS — L821 Other seborrheic keratosis: Secondary | ICD-10-CM | POA: Diagnosis not present

## 2020-11-05 DIAGNOSIS — I10 Essential (primary) hypertension: Secondary | ICD-10-CM | POA: Diagnosis not present

## 2020-11-05 DIAGNOSIS — Z2821 Immunization not carried out because of patient refusal: Secondary | ICD-10-CM | POA: Diagnosis not present

## 2020-11-05 DIAGNOSIS — Z6826 Body mass index (BMI) 26.0-26.9, adult: Secondary | ICD-10-CM | POA: Diagnosis not present

## 2020-11-05 DIAGNOSIS — Z1331 Encounter for screening for depression: Secondary | ICD-10-CM | POA: Diagnosis not present

## 2020-11-05 DIAGNOSIS — R7303 Prediabetes: Secondary | ICD-10-CM | POA: Diagnosis not present

## 2020-11-05 DIAGNOSIS — E7800 Pure hypercholesterolemia, unspecified: Secondary | ICD-10-CM | POA: Diagnosis not present

## 2020-11-18 DIAGNOSIS — J310 Chronic rhinitis: Secondary | ICD-10-CM | POA: Diagnosis not present

## 2020-11-18 DIAGNOSIS — J329 Chronic sinusitis, unspecified: Secondary | ICD-10-CM | POA: Diagnosis not present

## 2020-11-18 DIAGNOSIS — J3489 Other specified disorders of nose and nasal sinuses: Secondary | ICD-10-CM | POA: Diagnosis not present

## 2020-11-18 DIAGNOSIS — Z9889 Other specified postprocedural states: Secondary | ICD-10-CM | POA: Diagnosis not present

## 2020-12-18 DIAGNOSIS — Z6826 Body mass index (BMI) 26.0-26.9, adult: Secondary | ICD-10-CM | POA: Diagnosis not present

## 2020-12-18 DIAGNOSIS — J329 Chronic sinusitis, unspecified: Secondary | ICD-10-CM | POA: Diagnosis not present

## 2020-12-18 DIAGNOSIS — Z20828 Contact with and (suspected) exposure to other viral communicable diseases: Secondary | ICD-10-CM | POA: Diagnosis not present

## 2020-12-18 DIAGNOSIS — J40 Bronchitis, not specified as acute or chronic: Secondary | ICD-10-CM | POA: Diagnosis not present

## 2020-12-31 DIAGNOSIS — J329 Chronic sinusitis, unspecified: Secondary | ICD-10-CM | POA: Diagnosis not present

## 2020-12-31 DIAGNOSIS — Z6826 Body mass index (BMI) 26.0-26.9, adult: Secondary | ICD-10-CM | POA: Diagnosis not present

## 2020-12-31 DIAGNOSIS — J40 Bronchitis, not specified as acute or chronic: Secondary | ICD-10-CM | POA: Diagnosis not present

## 2021-02-05 DIAGNOSIS — L578 Other skin changes due to chronic exposure to nonionizing radiation: Secondary | ICD-10-CM | POA: Diagnosis not present

## 2021-02-05 DIAGNOSIS — L821 Other seborrheic keratosis: Secondary | ICD-10-CM | POA: Diagnosis not present

## 2021-02-05 DIAGNOSIS — L638 Other alopecia areata: Secondary | ICD-10-CM | POA: Diagnosis not present

## 2021-02-05 DIAGNOSIS — L570 Actinic keratosis: Secondary | ICD-10-CM | POA: Diagnosis not present

## 2021-02-19 DIAGNOSIS — M62830 Muscle spasm of back: Secondary | ICD-10-CM | POA: Diagnosis not present

## 2021-02-19 DIAGNOSIS — Z6827 Body mass index (BMI) 27.0-27.9, adult: Secondary | ICD-10-CM | POA: Diagnosis not present

## 2021-03-03 DIAGNOSIS — J3489 Other specified disorders of nose and nasal sinuses: Secondary | ICD-10-CM | POA: Diagnosis not present

## 2021-03-26 DIAGNOSIS — Z833 Family history of diabetes mellitus: Secondary | ICD-10-CM | POA: Diagnosis not present

## 2021-03-26 DIAGNOSIS — Z823 Family history of stroke: Secondary | ICD-10-CM | POA: Diagnosis not present

## 2021-03-26 DIAGNOSIS — Z008 Encounter for other general examination: Secondary | ICD-10-CM | POA: Diagnosis not present

## 2021-03-26 DIAGNOSIS — K219 Gastro-esophageal reflux disease without esophagitis: Secondary | ICD-10-CM | POA: Diagnosis not present

## 2021-03-26 DIAGNOSIS — N393 Stress incontinence (female) (male): Secondary | ICD-10-CM | POA: Diagnosis not present

## 2021-03-26 DIAGNOSIS — E785 Hyperlipidemia, unspecified: Secondary | ICD-10-CM | POA: Diagnosis not present

## 2021-03-26 DIAGNOSIS — Z803 Family history of malignant neoplasm of breast: Secondary | ICD-10-CM | POA: Diagnosis not present

## 2021-03-26 DIAGNOSIS — L659 Nonscarring hair loss, unspecified: Secondary | ICD-10-CM | POA: Diagnosis not present

## 2021-03-26 DIAGNOSIS — Z8249 Family history of ischemic heart disease and other diseases of the circulatory system: Secondary | ICD-10-CM | POA: Diagnosis not present

## 2021-03-26 DIAGNOSIS — I739 Peripheral vascular disease, unspecified: Secondary | ICD-10-CM | POA: Diagnosis not present

## 2021-03-26 DIAGNOSIS — H269 Unspecified cataract: Secondary | ICD-10-CM | POA: Diagnosis not present

## 2021-03-26 DIAGNOSIS — I10 Essential (primary) hypertension: Secondary | ICD-10-CM | POA: Diagnosis not present

## 2021-04-03 ENCOUNTER — Encounter: Payer: Self-pay | Admitting: Sports Medicine

## 2021-04-03 ENCOUNTER — Ambulatory Visit: Payer: Medicare HMO | Admitting: Sports Medicine

## 2021-04-03 ENCOUNTER — Other Ambulatory Visit: Payer: Self-pay | Admitting: Sports Medicine

## 2021-04-03 ENCOUNTER — Ambulatory Visit (INDEPENDENT_AMBULATORY_CARE_PROVIDER_SITE_OTHER): Payer: Medicare HMO

## 2021-04-03 DIAGNOSIS — M7660 Achilles tendinitis, unspecified leg: Secondary | ICD-10-CM

## 2021-04-03 DIAGNOSIS — M7662 Achilles tendinitis, left leg: Secondary | ICD-10-CM | POA: Diagnosis not present

## 2021-04-03 DIAGNOSIS — M2040 Other hammer toe(s) (acquired), unspecified foot: Secondary | ICD-10-CM | POA: Diagnosis not present

## 2021-04-03 DIAGNOSIS — M2041 Other hammer toe(s) (acquired), right foot: Secondary | ICD-10-CM | POA: Diagnosis not present

## 2021-04-03 DIAGNOSIS — M79672 Pain in left foot: Secondary | ICD-10-CM | POA: Diagnosis not present

## 2021-04-03 DIAGNOSIS — M79671 Pain in right foot: Secondary | ICD-10-CM

## 2021-04-03 NOTE — Progress Notes (Signed)
Subjective: ?Stacey Patterson is a 81 y.o. female patient who presents to office for bilateral foot pain.  Patient reports that she has some soreness at the back of her left heel for the last 2 weeks at the Achilles tendon area states that it hurts to walk and pain seems like it goes up the back of the heel occasionally with burning in nature with some swelling denies any known tendon injury or trauma or sprain to the area.  Patient also reports that sometimes her right second hammertoe rubs in shoes and gets a little sore but otherwise no other pedal complaints at this time. ? ?Patient Active Problem List  ? Diagnosis Date Noted  ? Impingement syndrome of left shoulder region 03/20/2020  ? 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 Cervical radiculopathy 03/28/2019  ? Pain in elbow 01/15/2019  ? Spider veins of both lower extremities 05/04/2016  ? Chest pain 12/07/2012  ? ANXIETY 10/07/2008  ? ARTHRITIS 10/07/2008  ? HYPERLIPIDEMIA 10/01/2008  ? HYPERTENSION 10/01/2008  ? ANAL OR RECTAL PAIN 10/01/2008  ? DIARRHEA 10/01/2008  ? ? ?Current Outpatient Medications on File Prior to Visit  ?Medication Sig Dispense Refill  ? finasteride (PROSCAR) 5 MG tablet Take 5 mg by mouth daily.    ? pantoprazole (PROTONIX) 20 MG tablet Take 20 mg by mouth daily.    ? Azelastine HCl 0.15 % SOLN Can use one to two sprays in each nostril one to two times daily as needed. 30 mL 5  ? cyclobenzaprine (FLEXERIL) 10 MG tablet cyclobenzaprine 10 mg tablet ? TAKE 1 TABLET EVERY 8 HOURS BY ORAL ROUTE AS NEEDED FOR SPASM FOR 10 DAYS.    ? diclofenac Sodium (VOLTAREN) 1 % GEL diclofenac 1 % topical gel ? APPLY 1 GRAM TOPICALLY TO BOTH AFFECTED JOINTS EVERY DAY    ? Multiple Vitamins-Minerals (HAIR SKIN NAILS PO) Take by mouth.    ? Polyethyl Glycol-Propyl Glycol (SYSTANE OP) Apply to eye.    ? valsartan-hydrochlorothiazide (DIOVAN-HCT) 320-12.5 MG per tablet Take 1 tablet by mouth daily.      ? ?Current Facility-Administered Medications on File Prior to Visit  ?Medication Dose Route Frequency Provider Last Rate Last Admin  ? triamcinolone acetonide (KENALOG) 10 MG/ML injection 10 mg  10 mg Other Once Landis Martins, DPM      ? ? ?Allergies  ?Allergen Reactions  ? Clarithromycin   ? Naproxen   ? Tape   ? Latex Itching and Rash  ? ? ?Objective:  ?General: Alert and oriented x3 in no acute distress ? ?Dermatology: No open lesions bilateral lower extremities, no webspace macerations, no ecchymosis bilateral, all nails x 10 are well manicured. ? ?Vascular: Dorsalis Pedis and Posterior Tibial pedal pulses faintly palpable, Capillary Fill Time 3 seconds,(+) scant pedal hair growth bilateral, no edema bilateral lower extremities, varicosities bilateral, temperature gradient within normal limits. ? ?Neurology: Gross sensation intact via light touch bilateral. ? ?Musculoskeletal: Mild tenderness with palpation at left heel at the Achilles insertion.  The Achilles feels intact with no palpable gap or dell.  Range of motion and strength appears to be normal without any loss of function especially on the left.  There is second hammertoe deformity with elevation noted. ? ?Gait: Minimally antalgic ? ?Xrays  ?Left and right foot ?  Impression: Joint space narrowing consistent with arthritis at first metatarsophalangeal joints bilateral, there is dorsal dislocated second hammertoe noted on the right.  There is very minimal  posterior and inferior heel spur noted left greater than right.  No fracture. ? ?Assessment and Plan: ?Problem List Items Addressed This Visit   ?None ?Visit Diagnoses   ? ? Foot pain, bilateral    -  Primary  ? Achilles tendon pain      ? Hammertoe of second toe of right foot      ? Relevant Orders  ? DG Foot Complete Right (Completed)  ? ?  ?  ? ?-Complete examination performed ?-Xrays reviewed ?-Discussed treatement options for Achilles tendon pain on the left ?-Recommend icing twice daily as  directed ?-Recommend gentle stretching as tolerated as long as it does not increase pain on the left for the Achilles ?-Dispensed a set of heel lifts for patient to use as directed ?-Advised patient to use over-the-counter Tylenol and any over-the-counter pain cream or rub as needed at the left Achilles ?-Discussed treatment options for elevated second toe that is rubbing in shoe on the right ?-Dispensed hammertoe regulator padding for patient to use as directed ?-Advised patient to wear good supportive shoes that do not rub toe ?-At this time since the area is not severely painful recommend continue with conservative care ?-Patient to return to office as needed or sooner if condition worsens. ? ?Landis Martins, DPM ? ?

## 2021-04-03 NOTE — Patient Instructions (Signed)
Dr. Lorenda Peck if you ever need additional help with your feet in the [place] office after Dr. Cannon Kettle leaves ?

## 2021-05-18 ENCOUNTER — Telehealth: Payer: Self-pay | Admitting: *Deleted

## 2021-05-18 DIAGNOSIS — I69351 Hemiplegia and hemiparesis following cerebral infarction affecting right dominant side: Secondary | ICD-10-CM | POA: Diagnosis not present

## 2021-05-18 NOTE — Telephone Encounter (Signed)
Patient called and said that her foot is not getting any better and is there anything to dissolve a spur?  ?

## 2021-05-19 NOTE — Telephone Encounter (Signed)
Called patient giving recommendations per physician, verbalized understanding but said that she does not wish to start PT as of yet, she is doing everything the doctor has suggested. I explained that she could call back if she later decides.

## 2021-05-25 DIAGNOSIS — Z79899 Other long term (current) drug therapy: Secondary | ICD-10-CM | POA: Diagnosis not present

## 2021-05-25 DIAGNOSIS — I10 Essential (primary) hypertension: Secondary | ICD-10-CM | POA: Diagnosis not present

## 2021-05-25 DIAGNOSIS — E7800 Pure hypercholesterolemia, unspecified: Secondary | ICD-10-CM | POA: Diagnosis not present

## 2021-05-25 DIAGNOSIS — Z1331 Encounter for screening for depression: Secondary | ICD-10-CM | POA: Diagnosis not present

## 2021-05-25 DIAGNOSIS — J411 Mucopurulent chronic bronchitis: Secondary | ICD-10-CM | POA: Diagnosis not present

## 2021-05-25 DIAGNOSIS — Z0000 Encounter for general adult medical examination without abnormal findings: Secondary | ICD-10-CM | POA: Diagnosis not present

## 2021-05-25 DIAGNOSIS — Z6827 Body mass index (BMI) 27.0-27.9, adult: Secondary | ICD-10-CM | POA: Diagnosis not present

## 2021-05-28 DIAGNOSIS — H40033 Anatomical narrow angle, bilateral: Secondary | ICD-10-CM | POA: Diagnosis not present

## 2021-06-02 DIAGNOSIS — R519 Headache, unspecified: Secondary | ICD-10-CM | POA: Diagnosis not present

## 2021-06-02 DIAGNOSIS — J3489 Other specified disorders of nose and nasal sinuses: Secondary | ICD-10-CM | POA: Diagnosis not present

## 2021-06-02 DIAGNOSIS — G8929 Other chronic pain: Secondary | ICD-10-CM | POA: Diagnosis not present

## 2021-06-02 DIAGNOSIS — Z87898 Personal history of other specified conditions: Secondary | ICD-10-CM | POA: Diagnosis not present

## 2021-06-02 DIAGNOSIS — J329 Chronic sinusitis, unspecified: Secondary | ICD-10-CM | POA: Diagnosis not present

## 2021-06-03 DIAGNOSIS — I10 Essential (primary) hypertension: Secondary | ICD-10-CM | POA: Diagnosis not present

## 2021-06-03 DIAGNOSIS — E785 Hyperlipidemia, unspecified: Secondary | ICD-10-CM | POA: Diagnosis not present

## 2021-06-07 DIAGNOSIS — R21 Rash and other nonspecific skin eruption: Secondary | ICD-10-CM | POA: Diagnosis not present

## 2021-06-07 DIAGNOSIS — W57XXXA Bitten or stung by nonvenomous insect and other nonvenomous arthropods, initial encounter: Secondary | ICD-10-CM | POA: Diagnosis not present

## 2021-09-01 DIAGNOSIS — J329 Chronic sinusitis, unspecified: Secondary | ICD-10-CM | POA: Diagnosis not present

## 2021-09-01 DIAGNOSIS — J3489 Other specified disorders of nose and nasal sinuses: Secondary | ICD-10-CM | POA: Diagnosis not present

## 2021-09-01 DIAGNOSIS — J310 Chronic rhinitis: Secondary | ICD-10-CM | POA: Diagnosis not present

## 2021-09-10 DIAGNOSIS — J320 Chronic maxillary sinusitis: Secondary | ICD-10-CM | POA: Diagnosis not present

## 2021-09-10 DIAGNOSIS — J3489 Other specified disorders of nose and nasal sinuses: Secondary | ICD-10-CM | POA: Diagnosis not present

## 2021-09-25 DIAGNOSIS — J3489 Other specified disorders of nose and nasal sinuses: Secondary | ICD-10-CM | POA: Diagnosis not present

## 2021-09-25 DIAGNOSIS — J320 Chronic maxillary sinusitis: Secondary | ICD-10-CM | POA: Diagnosis not present

## 2021-10-23 DIAGNOSIS — S29012A Strain of muscle and tendon of back wall of thorax, initial encounter: Secondary | ICD-10-CM | POA: Diagnosis not present

## 2021-11-17 DIAGNOSIS — I10 Essential (primary) hypertension: Secondary | ICD-10-CM | POA: Diagnosis not present

## 2021-11-17 DIAGNOSIS — Z6828 Body mass index (BMI) 28.0-28.9, adult: Secondary | ICD-10-CM | POA: Diagnosis not present

## 2021-11-17 DIAGNOSIS — J329 Chronic sinusitis, unspecified: Secondary | ICD-10-CM | POA: Diagnosis not present

## 2021-11-17 DIAGNOSIS — E7800 Pure hypercholesterolemia, unspecified: Secondary | ICD-10-CM | POA: Diagnosis not present

## 2021-12-09 DIAGNOSIS — J329 Chronic sinusitis, unspecified: Secondary | ICD-10-CM | POA: Diagnosis not present

## 2021-12-09 DIAGNOSIS — Z6828 Body mass index (BMI) 28.0-28.9, adult: Secondary | ICD-10-CM | POA: Diagnosis not present

## 2021-12-09 DIAGNOSIS — S86899A Other injury of other muscle(s) and tendon(s) at lower leg level, unspecified leg, initial encounter: Secondary | ICD-10-CM | POA: Diagnosis not present

## 2021-12-09 DIAGNOSIS — R69 Illness, unspecified: Secondary | ICD-10-CM | POA: Diagnosis not present

## 2021-12-24 DIAGNOSIS — J0191 Acute recurrent sinusitis, unspecified: Secondary | ICD-10-CM | POA: Diagnosis not present

## 2021-12-24 DIAGNOSIS — J069 Acute upper respiratory infection, unspecified: Secondary | ICD-10-CM | POA: Diagnosis not present

## 2022-01-14 DIAGNOSIS — Z1231 Encounter for screening mammogram for malignant neoplasm of breast: Secondary | ICD-10-CM | POA: Diagnosis not present

## 2022-01-14 DIAGNOSIS — Z01419 Encounter for gynecological examination (general) (routine) without abnormal findings: Secondary | ICD-10-CM | POA: Diagnosis not present

## 2022-01-19 ENCOUNTER — Other Ambulatory Visit: Payer: Self-pay | Admitting: Obstetrics and Gynecology

## 2022-01-19 DIAGNOSIS — R928 Other abnormal and inconclusive findings on diagnostic imaging of breast: Secondary | ICD-10-CM

## 2022-01-26 ENCOUNTER — Ambulatory Visit
Admission: RE | Admit: 2022-01-26 | Discharge: 2022-01-26 | Disposition: A | Discharge status: Home / Self Care | Payer: Medicare HMO | Source: Ambulatory Visit | Attending: Obstetrics and Gynecology | Admitting: Obstetrics and Gynecology

## 2022-01-26 ENCOUNTER — Other Ambulatory Visit: Payer: Self-pay | Admitting: Obstetrics and Gynecology

## 2022-01-26 DIAGNOSIS — R928 Other abnormal and inconclusive findings on diagnostic imaging of breast: Secondary | ICD-10-CM

## 2022-01-26 DIAGNOSIS — N6489 Other specified disorders of breast: Secondary | ICD-10-CM | POA: Diagnosis not present

## 2022-02-02 ENCOUNTER — Ambulatory Visit
Admission: RE | Admit: 2022-02-02 | Discharge: 2022-02-02 | Disposition: A | Discharge status: Home / Self Care | Payer: Medicare HMO | Source: Ambulatory Visit | Attending: Obstetrics and Gynecology | Admitting: Obstetrics and Gynecology

## 2022-02-02 DIAGNOSIS — N6011 Diffuse cystic mastopathy of right breast: Secondary | ICD-10-CM | POA: Diagnosis not present

## 2022-02-02 DIAGNOSIS — N6489 Other specified disorders of breast: Secondary | ICD-10-CM

## 2022-02-02 DIAGNOSIS — R928 Other abnormal and inconclusive findings on diagnostic imaging of breast: Secondary | ICD-10-CM | POA: Diagnosis not present

## 2022-02-02 HISTORY — PX: BREAST BIOPSY: SHX20

## 2022-02-08 DIAGNOSIS — L570 Actinic keratosis: Secondary | ICD-10-CM | POA: Diagnosis not present

## 2022-02-08 DIAGNOSIS — L728 Other follicular cysts of the skin and subcutaneous tissue: Secondary | ICD-10-CM | POA: Diagnosis not present

## 2022-02-08 DIAGNOSIS — L578 Other skin changes due to chronic exposure to nonionizing radiation: Secondary | ICD-10-CM | POA: Diagnosis not present

## 2022-02-08 DIAGNOSIS — L821 Other seborrheic keratosis: Secondary | ICD-10-CM | POA: Diagnosis not present

## 2022-02-25 DIAGNOSIS — I10 Essential (primary) hypertension: Secondary | ICD-10-CM | POA: Diagnosis not present

## 2022-02-25 DIAGNOSIS — K219 Gastro-esophageal reflux disease without esophagitis: Secondary | ICD-10-CM | POA: Diagnosis not present

## 2022-02-25 DIAGNOSIS — M19031 Primary osteoarthritis, right wrist: Secondary | ICD-10-CM | POA: Diagnosis not present

## 2022-02-25 DIAGNOSIS — Z8249 Family history of ischemic heart disease and other diseases of the circulatory system: Secondary | ICD-10-CM | POA: Diagnosis not present

## 2022-02-25 DIAGNOSIS — H269 Unspecified cataract: Secondary | ICD-10-CM | POA: Diagnosis not present

## 2022-02-25 DIAGNOSIS — R69 Illness, unspecified: Secondary | ICD-10-CM | POA: Diagnosis not present

## 2022-02-25 DIAGNOSIS — L680 Hirsutism: Secondary | ICD-10-CM | POA: Diagnosis not present

## 2022-02-25 DIAGNOSIS — Z809 Family history of malignant neoplasm, unspecified: Secondary | ICD-10-CM | POA: Diagnosis not present

## 2022-02-25 DIAGNOSIS — Z008 Encounter for other general examination: Secondary | ICD-10-CM | POA: Diagnosis not present

## 2022-02-25 DIAGNOSIS — F325 Major depressive disorder, single episode, in full remission: Secondary | ICD-10-CM | POA: Diagnosis not present

## 2022-02-25 DIAGNOSIS — Z823 Family history of stroke: Secondary | ICD-10-CM | POA: Diagnosis not present

## 2022-02-25 DIAGNOSIS — Z825 Family history of asthma and other chronic lower respiratory diseases: Secondary | ICD-10-CM | POA: Diagnosis not present

## 2022-02-25 DIAGNOSIS — J302 Other seasonal allergic rhinitis: Secondary | ICD-10-CM | POA: Diagnosis not present

## 2022-02-25 DIAGNOSIS — Z85828 Personal history of other malignant neoplasm of skin: Secondary | ICD-10-CM | POA: Diagnosis not present

## 2022-03-23 DIAGNOSIS — J320 Chronic maxillary sinusitis: Secondary | ICD-10-CM | POA: Diagnosis not present

## 2022-03-23 DIAGNOSIS — J329 Chronic sinusitis, unspecified: Secondary | ICD-10-CM | POA: Diagnosis not present

## 2022-03-23 DIAGNOSIS — H9192 Unspecified hearing loss, left ear: Secondary | ICD-10-CM | POA: Diagnosis not present

## 2022-03-23 DIAGNOSIS — J0101 Acute recurrent maxillary sinusitis: Secondary | ICD-10-CM | POA: Diagnosis not present

## 2022-04-15 DIAGNOSIS — J320 Chronic maxillary sinusitis: Secondary | ICD-10-CM | POA: Diagnosis not present

## 2022-04-15 DIAGNOSIS — H903 Sensorineural hearing loss, bilateral: Secondary | ICD-10-CM | POA: Diagnosis not present

## 2022-04-15 DIAGNOSIS — J310 Chronic rhinitis: Secondary | ICD-10-CM | POA: Diagnosis not present

## 2022-04-29 DIAGNOSIS — J329 Chronic sinusitis, unspecified: Secondary | ICD-10-CM | POA: Diagnosis not present

## 2022-06-03 DIAGNOSIS — H40039 Anatomical narrow angle, unspecified eye: Secondary | ICD-10-CM | POA: Diagnosis not present

## 2022-06-03 DIAGNOSIS — H2513 Age-related nuclear cataract, bilateral: Secondary | ICD-10-CM | POA: Diagnosis not present

## 2022-06-03 DIAGNOSIS — H11823 Conjunctivochalasis, bilateral: Secondary | ICD-10-CM | POA: Diagnosis not present

## 2022-06-03 DIAGNOSIS — H02831 Dermatochalasis of right upper eyelid: Secondary | ICD-10-CM | POA: Diagnosis not present

## 2022-06-07 ENCOUNTER — Other Ambulatory Visit: Payer: Self-pay | Admitting: Obstetrics and Gynecology

## 2022-06-07 DIAGNOSIS — R928 Other abnormal and inconclusive findings on diagnostic imaging of breast: Secondary | ICD-10-CM

## 2022-06-11 DIAGNOSIS — Z79899 Other long term (current) drug therapy: Secondary | ICD-10-CM | POA: Diagnosis not present

## 2022-06-11 DIAGNOSIS — Z1331 Encounter for screening for depression: Secondary | ICD-10-CM | POA: Diagnosis not present

## 2022-06-11 DIAGNOSIS — Z6828 Body mass index (BMI) 28.0-28.9, adult: Secondary | ICD-10-CM | POA: Diagnosis not present

## 2022-06-11 DIAGNOSIS — K219 Gastro-esophageal reflux disease without esophagitis: Secondary | ICD-10-CM | POA: Diagnosis not present

## 2022-06-11 DIAGNOSIS — I10 Essential (primary) hypertension: Secondary | ICD-10-CM | POA: Diagnosis not present

## 2022-06-11 DIAGNOSIS — Z0000 Encounter for general adult medical examination without abnormal findings: Secondary | ICD-10-CM | POA: Diagnosis not present

## 2022-06-11 DIAGNOSIS — E7800 Pure hypercholesterolemia, unspecified: Secondary | ICD-10-CM | POA: Diagnosis not present

## 2022-06-11 DIAGNOSIS — F418 Other specified anxiety disorders: Secondary | ICD-10-CM | POA: Diagnosis not present

## 2022-06-11 DIAGNOSIS — Z1339 Encounter for screening examination for other mental health and behavioral disorders: Secondary | ICD-10-CM | POA: Diagnosis not present

## 2022-06-29 DIAGNOSIS — J069 Acute upper respiratory infection, unspecified: Secondary | ICD-10-CM | POA: Diagnosis not present

## 2022-06-29 DIAGNOSIS — I10 Essential (primary) hypertension: Secondary | ICD-10-CM | POA: Diagnosis not present

## 2022-07-16 DIAGNOSIS — I10 Essential (primary) hypertension: Secondary | ICD-10-CM | POA: Diagnosis not present

## 2022-07-29 DIAGNOSIS — H02831 Dermatochalasis of right upper eyelid: Secondary | ICD-10-CM | POA: Diagnosis not present

## 2022-08-04 ENCOUNTER — Other Ambulatory Visit: Payer: Self-pay | Admitting: Obstetrics and Gynecology

## 2022-08-04 ENCOUNTER — Ambulatory Visit
Admission: RE | Admit: 2022-08-04 | Discharge: 2022-08-04 | Disposition: A | Discharge status: Home / Self Care | Payer: Medicare HMO | Source: Ambulatory Visit | Attending: Obstetrics and Gynecology | Admitting: Obstetrics and Gynecology

## 2022-08-04 ENCOUNTER — Ambulatory Visit: Admission: RE | Admit: 2022-08-04 | Payer: Medicare HMO | Source: Ambulatory Visit

## 2022-08-04 DIAGNOSIS — R928 Other abnormal and inconclusive findings on diagnostic imaging of breast: Secondary | ICD-10-CM

## 2022-08-11 ENCOUNTER — Ambulatory Visit
Admission: RE | Admit: 2022-08-11 | Discharge: 2022-08-11 | Disposition: A | Discharge status: Home / Self Care | Payer: Medicare HMO | Source: Ambulatory Visit | Attending: Obstetrics and Gynecology | Admitting: Obstetrics and Gynecology

## 2022-08-11 DIAGNOSIS — R928 Other abnormal and inconclusive findings on diagnostic imaging of breast: Secondary | ICD-10-CM | POA: Diagnosis not present

## 2022-08-11 HISTORY — PX: BREAST BIOPSY: SHX20

## 2022-09-02 DIAGNOSIS — R0789 Other chest pain: Secondary | ICD-10-CM | POA: Diagnosis not present

## 2022-09-02 DIAGNOSIS — Z6828 Body mass index (BMI) 28.0-28.9, adult: Secondary | ICD-10-CM | POA: Diagnosis not present

## 2022-09-02 DIAGNOSIS — I10 Essential (primary) hypertension: Secondary | ICD-10-CM | POA: Diagnosis not present

## 2022-11-09 ENCOUNTER — Ambulatory Visit: Payer: Medicare HMO | Attending: Cardiology | Admitting: Cardiology

## 2022-11-09 ENCOUNTER — Encounter: Payer: Self-pay | Admitting: Cardiology

## 2022-11-09 VITALS — BP 137/66 | HR 60 | Ht 62.0 in | Wt 160.0 lb

## 2022-11-09 DIAGNOSIS — E782 Mixed hyperlipidemia: Secondary | ICD-10-CM

## 2022-11-09 DIAGNOSIS — R072 Precordial pain: Secondary | ICD-10-CM

## 2022-11-09 DIAGNOSIS — R0789 Other chest pain: Secondary | ICD-10-CM | POA: Diagnosis not present

## 2022-11-09 DIAGNOSIS — I10 Essential (primary) hypertension: Secondary | ICD-10-CM | POA: Diagnosis not present

## 2022-11-09 DIAGNOSIS — Z01812 Encounter for preprocedural laboratory examination: Secondary | ICD-10-CM | POA: Diagnosis not present

## 2022-11-09 MED ORDER — METOPROLOL TARTRATE 100 MG PO TABS: ORAL_TABLET | ORAL | 0 refills | Status: DC

## 2022-11-09 NOTE — Progress Notes (Signed)
Cardiology Office Note:    Date:  11/13/2022   ID:  Stacey Patterson, DOB 06/29/1940, MRN [id number]  PCP:  Stacey Ponto, MD  Cardiologist:  Stacey Ripple, DO  Electrophysiologist:  None   Referring MD: Stacey Ponto, MD     History of Present Illness:    Stacey Patterson is a 82 y.o. female with a history of hypertension, presents with persistent chest pain that has been ongoing for several years. The pain is described as a hurting sensation located centrally in the chest. The pain is exacerbated by standing and doing activities such as washing dishes, and it often forces her to sit down. The intensity of the pain varies from mild to strong and is experienced daily. The patient also reports feeling lightheaded in the mornings, which she attributes to her blood pressure medication. She has been taking amlodipine and olmesartan for hypertension, but her blood pressure remains uncontrolled. The patient also has a history of a hiatal hernia and has undergone two biopsies for a lump in her right breast.    Past Medical History:  Diagnosis Date   Allergy    ANXIETY    ARTHRITIS    Arthritis    SHOULDERS   Asthma    AS A CHILD   Hiatal hernia    HYPERLIPIDEMIA    HYPERTENSION     Past Surgical History:  Procedure Laterality Date   ABDOMINAL HYSTERECTOMY     APPENDECTOMY     BREAST BIOPSY Right 02/02/2022   MM RT BREAST BX W LOC DEV 1ST LESION IMAGE BX SPEC STEREO GUIDE 02/02/2022 [facility]   BREAST BIOPSY Right 08/11/2022   MM RT BREAST BX W LOC DEV 1ST LESION IMAGE BX SPEC STEREO GUIDE 08/11/2022 [facility]   CARPAL TUNNEL RELEASE     COLONOSCOPY     LUMBAR DISC SURGERY     TONSILLECTOMY      Current Medications: Current Meds  Medication Sig   amLODipine (NORVASC) 5 MG tablet Take 5 mg by mouth at bedtime.   amLODipine-olmesartan (AZOR) 5-40 MG tablet Take 1 tablet by mouth daily.   Biotin 32440 MCG TABS Take by mouth daily.   citalopram (CELEXA) 10 MG  tablet Take 10 mg by mouth daily.   cyclobenzaprine (FLEXERIL) 10 MG tablet cyclobenzaprine 10 mg tablet  TAKE 1 TABLET EVERY 8 HOURS BY ORAL ROUTE AS NEEDED FOR SPASM FOR 10 DAYS.   diclofenac Sodium (VOLTAREN) 1 % GEL diclofenac 1 % topical gel  APPLY 1 GRAM TOPICALLY TO BOTH AFFECTED JOINTS EVERY DAY   finasteride (PROSCAR) 5 MG tablet Take 5 mg by mouth daily.   metoprolol tartrate (LOPRESSOR) 100 MG tablet Take 2 hours prior to CT   Multiple Vitamins-Minerals (HAIR SKIN NAILS PO) Take by mouth.   Omega-3 Fatty Acids (FISH OIL) 500 MG CAPS Take by mouth daily.   pantoprazole (PROTONIX) 20 MG tablet Take 20 mg by mouth daily.   Polyethyl Glycol-Propyl Glycol (SYSTANE OP) Apply to eye.   Red Yeast Rice 600 MG CAPS Take by mouth daily.   torsemide (DEMADEX) 10 MG tablet Take 10 mg by mouth in the morning.   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    Employer: [employer]  Tobacco Use   Smoking status: Never   Smokeless tobacco: Never  Substance and Sexual Activity   Alcohol use: No   Drug use: No   Sexual activity: Not on file  Other Topics Concern   Not on file  Social History Narrative   Not on file   Social Determinants of Health   Financial Resource Strain: Not on file  Food Insecurity: Not on file  Transportation Needs: Not on file  Physical Activity: Not on file  Stress: Not on file  Social Connections: Not on file     Family History: The patient's family history includes Allergy (severe) in her son; Breast  cancer in an other family member; CAD in her father and sister; Cancer in her maternal uncle; Cervical cancer in an other family member; Colon polyps in her mother; Coronary artery disease in her brother; Diabetes in her brother, father, and sister; Heart attack in her brother; Lung cancer in her brother and sister.  ROS:   Review of Systems  Constitution: Negative for decreased appetite, fever and weight gain.  HENT: Negative for congestion, ear discharge, hoarse voice and sore throat.   Eyes: Negative for discharge, redness, vision loss in right eye and visual halos.  Cardiovascular: Negative for chest pain, dyspnea on exertion, leg swelling, orthopnea and palpitations.  Respiratory: Negative for cough, hemoptysis, shortness of breath and snoring.   Endocrine: Negative for heat intolerance and polyphagia.  Hematologic/Lymphatic: Negative for bleeding problem. Does not bruise/bleed easily.  Skin: Negative for flushing, nail changes, rash and suspicious lesions.  Musculoskeletal: Negative for arthritis, joint pain, muscle cramps, myalgias, neck pain and stiffness.  Gastrointestinal: Negative for abdominal pain, bowel incontinence, diarrhea and excessive appetite.  Genitourinary: Negative for decreased libido, genital sores and incomplete emptying.  Neurological: Negative for brief paralysis, focal weakness, headaches and loss of balance.  Psychiatric/Behavioral: Negative for altered mental status, depression and suicidal ideas.  Allergic/Immunologic: Negative for HIV exposure and persistent infections.    EKGs/Labs/Other Studies Reviewed:    The following studies were reviewed today:   EKG:  The ekg ordered today demonstrates sinus rhythm, heart rate 80 bpm  Recent Labs: 11/09/2022: ALT 32; BUN 25; Creatinine, Ser 1.01; Magnesium 2.1; Potassium 5.0; Sodium 140  Recent Lipid Panel No results found for: "CHOL", "TRIG", "HDL", "CHOLHDL", "VLDL", "LDLCALC", "LDLDIRECT"  Physical Exam:     VS:  BP 137/66   Pulse 60   Ht 5\' 2"  (1.575 m)   Wt 160 lb (72.6 kg)   SpO2 96%   BMI 29.26 kg/m     Wt Readings from Last 3 Encounters:  11/09/22 160 lb (72.6 kg)  12/30/16 165 lb 9.6 oz (75.1 kg)  05/04/16 167 lb 8 oz (76 kg)     GEN: Well nourished, well developed in no acute distress HEENT: Normal NECK: No JVD; No carotid bruits LYMPHATICS: No lymphadenopathy CARDIAC: S1S2 noted,RRR, no murmurs, rubs, gallops RESPIRATORY:  Clear to auscultation without rales, wheezing or rhonchi  ABDOMEN: Soft, non-tender, non-distended, +bowel sounds, no guarding. EXTREMITIES: No edema, No cyanosis, no clubbing MUSCULOSKELETAL:  No deformity  SKIN: Warm and dry NEUROLOGIC:  Alert and oriented x 3, non-focal PSYCHIATRIC:  Normal affect, good insight  ASSESSMENT:    1. Chest pain, atypical  2. Pre-procedure lab exam   3. Precordial pain   4. HYPERTENSION   5. Mixed hyperlipidemia    PLAN:     Chest Pain Persistent chest pain, particularly when standing, for several years. Pain is described as central and severe enough to require sitting down. Previous stress test in 2014 was normal. Differential diagnosis includes cardiac etiology vs hiatal hernia vs musculoskeletal pain. -Order Coronary CTA to visualize arteries and assess for hiatal hernia.  Hypertension Blood pressure remains elevated despite current regimen of Amlodipine/Olmesartan combination and additional Amlodipine at night. Patient reports occasional lightheadedness. -Continue current antihypertensive regimen. -Consider medication adjustment based on future blood pressure readings and symptoms.  Hyperlipidemia Patient is not currently on a statin due to previous side effects. Patient is taking red yeast rice and fish oil for cholesterol management. -Consider re-evaluation of lipid profile and potential statin therapy based on results.  The patient is in agreement with the above plan. The patient left the office in  stable condition.  The patient will follow up in   Medication Adjustments/Labs and Tests Ordered: Current medicines are reviewed at length with the patient today.  Concerns regarding medicines are outlined above.  Orders Placed This Encounter  Procedures   CT CORONARY MORPH W/CTA COR W/SCORE W/CA W/CM &/OR WO/CM   Comprehensive Metabolic Panel (CMET)   Magnesium   EKG 12-Lead   Meds ordered this encounter  Medications   metoprolol tartrate (LOPRESSOR) 100 MG tablet    Sig: Take 2 hours prior to CT    Dispense:  1 tablet    Refill:  0    Patient Instructions  Medication Instructions:  Your physician recommends that you continue on your current medications as directed. Please refer to the Current Medication list given to you today.  *If you need a refill on your cardiac medications before your next appointment, please call your pharmacy*   Lab Work: CMET, Mag If you have labs (blood work) drawn today and your tests are completely normal, you will receive your results only by: MyChart Message (if you have MyChart) OR A paper copy in the mail If you have any lab test that is abnormal or we need to change your treatment, we will call you to review the results.   Testing/Procedures:   Your cardiac CT will be scheduled at one of the below locations:   [facility] [address] [place], [place] [address] [phone]  If scheduled at [facility], please arrive at the [facility] and Children's Entrance (Entrance C2) of [facility] 30 minutes prior to test start time. You can use the FREE valet parking offered at entrance C (encouraged to control the heart rate for the test)  Proceed to the [facility] Radiology Department (first floor) to check-in and test prep.  All radiology patients and guests should use entrance C2 at [facility], accessed from [facility], even though the hospital's physical address listed is [address].    Please follow these instructions carefully (unless otherwise directed):  An IV will be required for this test and Nitroglycerin will be given.   On the Night Before the Test: Be sure to Drink plenty of water. Do not consume any caffeinated/decaffeinated beverages or chocolate 12 hours prior to your test. Do not take any antihistamines 12 hours prior to your test.  On the Day of the Test: Drink plenty of water until 1 hour prior to the test. Do not eat any  food 1 hour prior to test. You may take your regular medications prior to the test.  Take metoprolol (Lopressor) two hours prior to test. If you take Furosemide/Hydrochlorothiazide/Spironolactone/Torsemide, please HOLD on the morning of the test. FEMALES- please wear underwire-free bra if available, avoid dresses & tight clothing      After the Test: Drink plenty of water. After receiving IV contrast, you may experience a mild flushed feeling. This is normal. On occasion, you may experience a mild rash up to 24 hours after the test. This is not dangerous. If this occurs, you can take Benadryl 25 mg and increase your fluid intake. If you experience trouble breathing, this can be serious. If it is severe call 911 IMMEDIATELY. If it is mild, please call our office. If you take any of these medications: Glipizide/Metformin, Avandament, Glucavance, please do not take 48 hours after completing test unless otherwise instructed.  We will call to schedule your test 2-4 weeks out understanding that some insurance companies will need an authorization prior to the service being performed.   For more information and frequently asked questions, please visit our website : [email or web address]  For non-scheduling related questions, please contact the cardiac imaging nurse navigator should you have any questions/concerns: Cardiac Imaging Nurse Navigators Direct Office Dial: [phone]   For scheduling needs, including  cancellations and rescheduling, please call [place], [phone].    Follow-Up: At [facility], you and your health needs are our priority.  As part of our continuing mission to provide you with exceptional heart care, we have created designated Provider Care Teams.  These Care Teams include your primary Cardiologist (physician) and Advanced Practice Providers (APPs -  Physician Assistants and Nurse Practitioners) who all work together to provide you with the care you need, when you need it.  We recommend signing up for the patient portal called "MyChart".  Sign up information is provided on this After Visit Summary.  MyChart is used to connect with patients for Virtual Visits (Telemedicine).  Patients are able to view lab/test results, encounter notes, upcoming appointments, etc.  Non-urgent messages can be sent to your provider as well.   To learn more about what you can do with MyChart, go to [email or web address].    Your next appointment:   16 week(s)  Provider:   Thomasene Ripple, DO       Adopting a Healthy Lifestyle.  Know what a healthy weight is for you (roughly BMI <25) and aim to maintain this   Aim for 7+ servings of fruits and vegetables daily   65-80+ fluid ounces of water or unsweet tea for healthy kidneys   Limit to max 1 drink of alcohol per day; avoid smoking/tobacco   Limit animal fats in diet for cholesterol and heart health - choose grass fed whenever available   Avoid highly processed foods, and foods high in saturated/trans fats   Aim for low stress - take time to unwind and care for your mental health   Aim for 150 min of moderate intensity exercise weekly for heart health, and weights twice weekly for bone health   Aim for 7-9 hours of sleep daily   When it comes to diets, agreement about the perfect plan isnt easy to find, even among the experts. Experts at the [facility] [employer] developed an idea known as the Healthy Eating  Plate. Just imagine a plate divided into logical, healthy portions.   The emphasis is on diet quality:   Load up on vegetables  and fruits - one-half of your plate: Aim for color and variety, and remember that potatoes dont count.   Go for whole grains - one-quarter of your plate: Whole wheat, barley, wheat berries, quinoa, oats, brown rice, and foods made with them. If you want pasta, go with whole wheat pasta.   Protein power - one-quarter of your plate: Fish, chicken, beans, and nuts are all healthy, versatile protein sources. Limit red meat.   The diet, however, does go beyond the plate, offering a few other suggestions.   Use healthy plant oils, such as olive, canola, soy, corn, sunflower and peanut. Check the labels, and avoid partially hydrogenated oil, which have unhealthy trans fats.   If youre thirsty, drink water. Coffee and tea are good in moderation, but skip sugary drinks and limit milk and dairy products to one or two daily servings.   The type of carbohydrate in the diet is more important than the amount. Some sources of carbohydrates, such as vegetables, fruits, whole grains, and beans-are healthier than others.   Finally, stay active  Signed, Stacey Ripple, DO  11/13/2022 12:12 PM    [facility]

## 2022-11-09 NOTE — Patient Instructions (Addendum)
Medication Instructions:  Your physician recommends that you continue on your current medications as directed. Please refer to the Current Medication list given to you today.  *If you need a refill on your cardiac medications before your next appointment, please call your pharmacy*   Lab Work: CMET, Mag If you have labs (blood work) drawn today and your tests are completely normal, you will receive your results only by: MyChart Message (if you have MyChart) OR A paper copy in the mail If you have any lab test that is abnormal or we need to change your treatment, we will call you to review the results.   Testing/Procedures:   Your cardiac CT will be scheduled at one of the below locations:   [facility] [address] [place], [place] [address] [phone]  If scheduled at [facility], please arrive at the [facility] Children[facility] Entrance (Entrance C2) of [facility] 30 minutes prior to test start time. You can use the FREE valet parking offered at entrance C (encouraged to control the heart rate for the test)  Proceed to the [facility] Radiology Department (first floor) to check-in and test prep.  All radiology patients and guests should use entrance C2 at [facility], accessed from [facility], even though the hospital's physical address listed is [address].    Please follow these instructions carefully (unless otherwise directed):  An IV will be required for this test and Nitroglycerin will be given.   On the Night Before the Test: Be sure to Drink plenty of water. Do not consume any caffeinated/decaffeinated beverages or chocolate 12 hours prior to your test. Do not take any antihistamines 12 hours prior to your test.  On the Day of the Test: Drink plenty of water until 1 hour prior to the test. Do not eat any food 1 hour prior to test. You may take your regular medications prior to the test.  Take  metoprolol (Lopressor) two hours prior to test. If you take Furosemide/Hydrochlorothiazide/Spironolactone/Torsemide, please HOLD on the morning of the test. FEMALES- please wear underwire-free bra if available, avoid dresses & tight clothing      After the Test: Drink plenty of water. After receiving IV contrast, you may experience a mild flushed feeling. This is normal. On occasion, you may experience a mild rash up to 24 hours after the test. This is not dangerous. If this occurs, you can take Benadryl 25 mg and increase your fluid intake. If you experience trouble breathing, this can be serious. If it is severe call 911 IMMEDIATELY. If it is mild, please call our office. If you take any of these medications: Glipizide/Metformin, Avandament, Glucavance, please do not take 48 hours after completing test unless otherwise instructed.  We will call to schedule your test 2-4 weeks out understanding that some insurance companies will need an authorization prior to the service being performed.   For more information and frequently asked questions, please visit our website : [email or web address]  For non-scheduling related questions, please contact the cardiac imaging nurse navigator should you have any questions/concerns: Cardiac Imaging Nurse Navigators Direct Office Dial: [phone]   For scheduling needs, including cancellations and rescheduling, please call [place], [phone].    Follow-Up: At [facility], you and your health needs are our priority.  As part of our continuing mission to provide you with exceptional heart care, we have created designated Provider Care Teams.  These Care Teams include your primary Cardiologist (physician)  and Advanced Practice Providers (APPs -  Physician Assistants and Nurse Practitioners) who all work together to provide you with the care you need, when you need it.  We recommend signing up for the patient portal called  "MyChart".  Sign up information is provided on this After Visit Summary.  MyChart is used to connect with patients for Virtual Visits (Telemedicine).  Patients are able to view lab/test results, encounter notes, upcoming appointments, etc.  Non-urgent messages can be sent to your provider as well.   To learn more about what you can do with MyChart, go to [email or web address].    Your next appointment:   16 week(s)  Provider:   Thomasene Ripple, DO

## 2022-11-10 LAB — COMPREHENSIVE METABOLIC PANEL
ALT: 32 [IU]/L (ref 0–32)
AST: 33 [IU]/L (ref 0–40)
Albumin: 4.6 g/dL (ref 3.8–4.8)
Alkaline Phosphatase: 119 [IU]/L (ref 44–121)
BUN/Creatinine Ratio: 25 (ref 12–28)
BUN: 25 mg/dL (ref 8–27)
Bilirubin Total: 0.3 mg/dL (ref 0.0–1.2)
CO2: 22 mmol/L (ref 20–29)
Calcium: 9.6 mg/dL (ref 8.7–10.3)
Chloride: 101 mmol/L (ref 96–106)
Creatinine, Ser: 1.01 mg/dL — ABNORMAL HIGH (ref 0.57–1.00)
Globulin, Total: 2.5 g/dL (ref 1.5–4.5)
Glucose: 113 mg/dL — ABNORMAL HIGH (ref 70–99)
Potassium: 5 mmol/L (ref 3.5–5.2)
Sodium: 140 mmol/L (ref 134–144)
Total Protein: 7.1 g/dL (ref 6.0–8.5)
eGFR: 56 mL/min/{1.73_m2} — ABNORMAL LOW (ref >59)

## 2022-11-10 LAB — MAGNESIUM: Magnesium: 2.1 mg/dL (ref 1.6–2.3)

## 2022-11-15 DIAGNOSIS — Z6828 Body mass index (BMI) 28.0-28.9, adult: Secondary | ICD-10-CM | POA: Diagnosis not present

## 2022-11-15 DIAGNOSIS — J40 Bronchitis, not specified as acute or chronic: Secondary | ICD-10-CM | POA: Diagnosis not present

## 2022-11-15 DIAGNOSIS — J329 Chronic sinusitis, unspecified: Secondary | ICD-10-CM | POA: Diagnosis not present

## 2022-11-24 ENCOUNTER — Telehealth (HOSPITAL_COMMUNITY): Payer: Self-pay | Admitting: *Deleted

## 2022-11-24 NOTE — Telephone Encounter (Signed)
Reaching out to patient to offer assistance regarding upcoming cardiac imaging study; pt verbalizes understanding of appt date/time, parking situation and where to check in, pre-test NPO status and medications ordered, and verified current allergies; name and call back number provided for further questions should they arise  Larey Brick RN Navigator Cardiac Imaging [facility] [phone] office [phone] cell  Patient to check her HR and will take 100mg  metoprolol tartrate if her HR is greater than 70bpm. She is aware to arrive at 8 AM. Patient checked her BP while on phone today and it was 164/75 with a HR of 91.

## 2022-11-25 ENCOUNTER — Ambulatory Visit (HOSPITAL_COMMUNITY)
Admission: RE | Admit: 2022-11-25 | Discharge: 2022-11-25 | Disposition: A | Discharge status: Home / Self Care | Payer: Medicare HMO | Source: Ambulatory Visit | Attending: Cardiology | Admitting: Cardiology

## 2022-11-25 DIAGNOSIS — R072 Precordial pain: Secondary | ICD-10-CM | POA: Insufficient documentation

## 2022-11-25 MED ORDER — NITROGLYCERIN 0.4 MG SL SUBL
0.8000 mg | SUBLINGUAL_TABLET | Freq: Once | SUBLINGUAL | Status: AC
  Administered 2022-11-25: 0.8 mg via SUBLINGUAL

## 2022-11-25 MED ORDER — NITROGLYCERIN 0.4 MG SL SUBL
SUBLINGUAL_TABLET | SUBLINGUAL | Status: AC
  Filled 2022-11-25: qty 2

## 2022-11-25 MED ORDER — IOHEXOL 350 MG/ML SOLN
100.0000 mL | Freq: Once | INTRAVENOUS | Status: AC | PRN
  Administered 2022-11-25: 100 mL via INTRAVENOUS

## 2022-12-06 ENCOUNTER — Telehealth: Payer: Self-pay | Admitting: Cardiology

## 2022-12-06 DIAGNOSIS — E782 Mixed hyperlipidemia: Secondary | ICD-10-CM

## 2022-12-06 NOTE — Telephone Encounter (Signed)
Spoke to patient Dr.Tobb's advice given.Advised scheduler will call back with Pharm D lipid clinic appointment.

## 2022-12-06 NOTE — Telephone Encounter (Signed)
Called and spoke to patient. Below message relayed per Dr Servando Salina. Patient stated that she can not tolerate Crestor. She tried it in the past and had severe leg cramps.     Thomasene Ripple, DO 11/28/2022  5:44 PM EST   Your coronary CT scan showed minimal coronary artery disease, nothing you need any procedure or intervention at this time.  It would be beneficial to start you on low-dose Crestor 5 mg daily.  I am still waiting for the noncardiac portion of the test I will let you know when I get those results.

## 2022-12-06 NOTE — Telephone Encounter (Signed)
Patient is calling to speak with a nurse in regards to her CT Scan results. Please advise.

## 2022-12-13 DIAGNOSIS — Z6828 Body mass index (BMI) 28.0-28.9, adult: Secondary | ICD-10-CM | POA: Diagnosis not present

## 2022-12-13 DIAGNOSIS — J329 Chronic sinusitis, unspecified: Secondary | ICD-10-CM | POA: Diagnosis not present

## 2022-12-13 DIAGNOSIS — E7800 Pure hypercholesterolemia, unspecified: Secondary | ICD-10-CM | POA: Diagnosis not present

## 2022-12-17 DIAGNOSIS — K219 Gastro-esophageal reflux disease without esophagitis: Secondary | ICD-10-CM | POA: Diagnosis not present

## 2022-12-17 DIAGNOSIS — H02834 Dermatochalasis of left upper eyelid: Secondary | ICD-10-CM | POA: Diagnosis not present

## 2022-12-17 DIAGNOSIS — E785 Hyperlipidemia, unspecified: Secondary | ICD-10-CM | POA: Diagnosis not present

## 2022-12-17 DIAGNOSIS — I10 Essential (primary) hypertension: Secondary | ICD-10-CM | POA: Diagnosis not present

## 2022-12-17 DIAGNOSIS — H02831 Dermatochalasis of right upper eyelid: Secondary | ICD-10-CM | POA: Diagnosis not present

## 2022-12-17 DIAGNOSIS — H57813 Brow ptosis, bilateral: Secondary | ICD-10-CM | POA: Diagnosis not present

## 2023-01-24 DIAGNOSIS — K7689 Other specified diseases of liver: Secondary | ICD-10-CM | POA: Diagnosis not present

## 2023-01-25 ENCOUNTER — Ambulatory Visit: Payer: Medicare Other | Attending: Cardiology | Admitting: Pharmacist Clinician (PhC)/ Clinical Pharmacy Specialist

## 2023-01-25 DIAGNOSIS — E785 Hyperlipidemia, unspecified: Secondary | ICD-10-CM | POA: Diagnosis not present

## 2023-01-25 NOTE — Progress Notes (Unsigned)
Office Visit    Patient Name: Stacey Patterson Date of Encounter: 01/26/2023  Primary Care Provider:  Marylen Ponto, MD Primary Cardiologist:  Thomasene Ripple, DO  Chief Complaint    Hyperlipidemia   Significant Past Medical History   HTN On amlodipine/omesartan     Allergies  Allergen Reactions   Clarithromycin    Naproxen    Tape    Latex Itching and Rash    History of Present Illness    HPI: 83 yo f who is coming in today to discuss treatment for hyperlipidemia. In the past she has tried a statin, but complained of non-specific side effects. She has been taking omega-3 and red yeast rice, and at her last visit with her PCP, was started on ezetimibe 10 mg daily.  PMH: HLD, HTN Family History Father: CAD, DM, stroke, died in 52's Mother: Colon polyps, CHF, died in 71's Siblings Brother: CAD, DM, MI Sister: CAD, DM,  Social History: Tobacco: no Alcohol: no Diet:  Does not eat out much Grilled chicken Brussel sprouts and sweet potatoes   Accessory Clinical Findings  Lipid Panel (06/2022) TC: 234 TG: 176 HDL: 54 LDL: 145 Coronary Calcium Score: 0 Total plaque volume: 23 (5th percentile)  No results found for: "LIPOA"  Lab Results  Component Value Date   ALT 32 11/09/2022   AST 33 11/09/2022   ALKPHOS 119 11/09/2022   BILITOT 0.3 11/09/2022   Lab Results  Component Value Date   CREATININE 1.01 (H) 11/09/2022   BUN 25 11/09/2022   NA 140 11/09/2022   K 5.0 11/09/2022   CL 101 11/09/2022   CO2 22 11/09/2022   No results found for: "HGBA1C"  Home Medications    Current Outpatient Medications  Medication Sig Dispense Refill   ezetimibe (ZETIA) 10 MG tablet Take 10 mg by mouth daily.     amLODipine (NORVASC) 5 MG tablet Take 5 mg by mouth at bedtime.     amLODipine-olmesartan (AZOR) 5-40 MG tablet Take 1 tablet by mouth daily.     Biotin 16109 MCG TABS Take by mouth daily.     citalopram (CELEXA) 10 MG tablet Take 10 mg by mouth daily.      cyclobenzaprine (FLEXERIL) 10 MG tablet cyclobenzaprine 10 mg tablet  TAKE 1 TABLET EVERY 8 HOURS BY ORAL ROUTE AS NEEDED FOR SPASM FOR 10 DAYS.     diclofenac Sodium (VOLTAREN) 1 % GEL diclofenac 1 % topical gel  APPLY 1 GRAM TOPICALLY TO BOTH AFFECTED JOINTS EVERY DAY     finasteride (PROSCAR) 5 MG tablet Take 5 mg by mouth daily.     fluticasone (FLONASE) 50 MCG/ACT nasal spray Place 2 sprays into both nostrils 2 (two) times daily.     metoprolol tartrate (LOPRESSOR) 100 MG tablet Take 2 hours prior to CT 1 tablet 0   Multiple Vitamins-Minerals (HAIR SKIN NAILS PO) Take by mouth.     Omega-3 Fatty Acids (FISH OIL) 500 MG CAPS Take by mouth daily.     pantoprazole (PROTONIX) 20 MG tablet Take 20 mg by mouth daily.     Polyethyl Glycol-Propyl Glycol (SYSTANE OP) Apply to eye.     Red Yeast Rice 600 MG CAPS Take by mouth daily.     torsemide (DEMADEX) 10 MG tablet Take 10 mg by mouth in the morning.     Current Facility-Administered Medications  Medication Dose Route Frequency Provider Last Rate Last Admin   triamcinolone acetonide (KENALOG) 10 MG/ML injection 10 mg  10 mg Other Once Asencion Islam, DPM         Assessment & Plan    Hyperlipidemia HLD:  Goal LDL <100, not controlled Counseled patient on most recent lipid panel and coronary calcium score, and based on the calcium score will opt not to modify therapy at this time Patient not interested in injectable or other options at this time Continue taking the ezetimibe Follow up with PCP in June for repeat lipid panel  Wilford Corner PharmD Candidate [employer] Class of 2025  I was with student and patient for entire appointment and agree with above assessment and plan.   Phillips Hay, PharmD CPP [facility] [address]  [place], [place] [address] [phone]

## 2023-01-25 NOTE — Patient Instructions (Signed)
Your Results:             Your most recent labs Goal  Total Cholesterol 234 < 200  Triglycerides 176 < 150  HDL (happy/good cholesterol) 54 > 40  LDL (lousy/bad cholesterol 145 < 100   Medication changes:  Continue with the ezetimibe 10 mg daily.   Lab orders:  Repeat labs in Jun  We will send you a lab order to remind you once we get closer to that time.    Patient Assistance:    We will sign you up for a [employer] Grant once your medication is approved by [employer].  I will call you with the ID number, then you will take this information to the pharmacy.  They will bill it after your insurance, bringing your copay to $0.  The grant will pay the first $2,500 in a one year period.    ID   BIN [id number]  PCN [id number]  GRP [id number]    Thank you for choosing [facility]

## 2023-01-26 ENCOUNTER — Encounter: Payer: Self-pay | Admitting: Pharmacist Clinician (PhC)/ Clinical Pharmacy Specialist

## 2023-01-26 NOTE — Assessment & Plan Note (Signed)
HLD:  Goal LDL <100, not controlled Counseled patient on most recent lipid panel and coronary calcium score, and based on the calcium score will opt not to modify therapy at this time Patient not interested in injectable or other options at this time Continue taking the ezetimibe Follow up with PCP in June for repeat lipid panel

## 2023-02-07 DIAGNOSIS — K769 Liver disease, unspecified: Secondary | ICD-10-CM | POA: Diagnosis not present

## 2023-02-14 DIAGNOSIS — L570 Actinic keratosis: Secondary | ICD-10-CM | POA: Diagnosis not present

## 2023-02-14 DIAGNOSIS — L821 Other seborrheic keratosis: Secondary | ICD-10-CM | POA: Diagnosis not present

## 2023-02-14 DIAGNOSIS — L820 Inflamed seborrheic keratosis: Secondary | ICD-10-CM | POA: Diagnosis not present

## 2023-02-17 DIAGNOSIS — Z1231 Encounter for screening mammogram for malignant neoplasm of breast: Secondary | ICD-10-CM | POA: Diagnosis not present

## 2023-02-25 DIAGNOSIS — I10 Essential (primary) hypertension: Secondary | ICD-10-CM | POA: Diagnosis not present

## 2023-02-25 DIAGNOSIS — T8189XS Other complications of procedures, not elsewhere classified, sequela: Secondary | ICD-10-CM | POA: Diagnosis not present

## 2023-02-25 DIAGNOSIS — E7800 Pure hypercholesterolemia, unspecified: Secondary | ICD-10-CM | POA: Diagnosis not present

## 2023-03-08 ENCOUNTER — Ambulatory Visit: Payer: Medicare Other | Attending: Cardiology | Admitting: Cardiology

## 2023-03-08 ENCOUNTER — Encounter: Payer: Self-pay | Admitting: Cardiology

## 2023-03-08 VITALS — BP 142/60 | HR 64 | Ht 62.0 in | Wt 162.0 lb

## 2023-03-08 DIAGNOSIS — I2510 Atherosclerotic heart disease of native coronary artery without angina pectoris: Secondary | ICD-10-CM

## 2023-03-08 DIAGNOSIS — I10 Essential (primary) hypertension: Secondary | ICD-10-CM

## 2023-03-08 NOTE — Patient Instructions (Signed)
 Medication Instructions:  No Changes  Lab Work: Will have labs drawn at [facility].   Testing/Procedures: None  Follow-Up: At [facility], you and your health needs are our priority.  As part of our continuing mission to provide you with exceptional heart care, we have created designated Provider Care Teams.  These Care Teams include your primary Cardiologist (physician) and Advanced Practice Providers (APPs -  Physician Assistants and Nurse Practitioners) who all work together to provide you with the care you need, when you need it.  We recommend signing up for the patient portal called "MyChart".  Sign up information is provided on this After Visit Summary.  MyChart is used to connect with patients for Virtual Visits (Telemedicine).  Patients are able to view lab/test results, encounter notes, upcoming appointments, etc.  Non-urgent messages can be sent to your provider as well.   To learn more about what you can do with MyChart, go to [email or web address].    Your next appointment:   12 month(s)  Provider:   Thomasene Ripple, DO

## 2023-03-08 NOTE — Progress Notes (Signed)
 Cardiology Office Note:    Date:  03/08/2023   ID:  Stacey Patterson, DOB [birth year], MRN [id number]  PCP:  Marylen Ponto, MD  Cardiologist:  Thomasene Ripple, DO  Electrophysiologist:  None   Referring MD: Marylen Ponto, MD   " I am ok"   History of Present Illness:    Stacey Patterson is a 83 y.o. female with a history of hypertension, coronary artery disease, hiatal hernia, hyperlipidemia here for follow-up visit.  At her last visit she was experiencing chest pain a send the patient for coronary CTA came back with minimal CAD.  She cannot tolerate statins.  She has been started on Zetia for other lipid-lowering agent.  No chest pain or shortness of breath today.  She is doing well from a cardiac standpoint.    Past Medical History:  Diagnosis Date   Allergy    ANXIETY    ARTHRITIS    Arthritis    SHOULDERS   Asthma    AS A CHILD   Hiatal hernia    HYPERLIPIDEMIA    HYPERTENSION     Past Surgical History:  Procedure Laterality Date   ABDOMINAL HYSTERECTOMY     APPENDECTOMY     BREAST BIOPSY Right 02/02/2022   MM RT BREAST BX W LOC DEV 1ST LESION IMAGE BX SPEC STEREO GUIDE 02/02/2022 [facility]   BREAST BIOPSY Right 08/11/2022   MM RT BREAST BX W LOC DEV 1ST LESION IMAGE BX SPEC STEREO GUIDE 08/11/2022 [facility]   CARPAL TUNNEL RELEASE     COLONOSCOPY     LUMBAR DISC SURGERY     TONSILLECTOMY      Current Medications: Current Meds  Medication Sig   amLODipine (NORVASC) 5 MG tablet Take 5 mg by mouth at bedtime.   amLODipine-olmesartan (AZOR) 5-40 MG tablet Take 1 tablet by mouth daily.   Biotin 47829 MCG TABS Take by mouth daily.   citalopram (CELEXA) 10 MG tablet Take 10 mg by mouth daily.   cyclobenzaprine (FLEXERIL) 10 MG tablet cyclobenzaprine 10 mg tablet  TAKE 1 TABLET EVERY 8 HOURS BY ORAL ROUTE AS NEEDED FOR SPASM FOR 10 DAYS.   diclofenac Sodium (VOLTAREN) 1 % GEL diclofenac 1 % topical gel  APPLY 1 GRAM TOPICALLY TO BOTH AFFECTED JOINTS  EVERY DAY   ezetimibe (ZETIA) 10 MG tablet Take 10 mg by mouth daily.   finasteride (PROSCAR) 5 MG tablet Take 5 mg by mouth daily.   fluticasone (FLONASE) 50 MCG/ACT nasal spray Place 2 sprays into both nostrils 2 (two) times daily.   pantoprazole (PROTONIX) 20 MG tablet Take 20 mg by mouth daily.   Polyethyl Glycol-Propyl Glycol (SYSTANE OP) Apply to eye.   torsemide (DEMADEX) 10 MG tablet Take 10 mg by mouth in the morning.   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    Employer: [employer]  Tobacco Use   Smoking status: Never   Smokeless tobacco: Never  Substance and Sexual Activity   Alcohol use: No  Drug use: No   Sexual activity: Not on file  Other Topics Concern   Not on file  Social History Narrative   Not on file   Social Drivers of Health   Financial Resource Strain: Not on file  Food Insecurity: Not on file  Transportation Needs: Not on file  Physical Activity: Not on file  Stress: Not on file  Social Connections: Not on file     Family History: The patient's family history includes Allergy (severe) in her son; Breast cancer in an other family member; CAD in her father and sister; Cancer in her maternal uncle; Cervical cancer in an other family member; Colon polyps in her mother; Coronary artery disease in her brother; Diabetes in her brother, father, and sister; Heart attack in her brother; Lung cancer in her brother and sister.  ROS:   Review of Systems  Constitution: Negative for decreased appetite, fever  and weight gain.  HENT: Negative for congestion, ear discharge, hoarse voice and sore throat.   Eyes: Negative for discharge, redness, vision loss in right eye and visual halos.  Cardiovascular: Negative for chest pain, dyspnea on exertion, leg swelling, orthopnea and palpitations.  Respiratory: Negative for cough, hemoptysis, shortness of breath and snoring.   Endocrine: Negative for heat intolerance and polyphagia.  Hematologic/Lymphatic: Negative for bleeding problem. Does not bruise/bleed easily.  Skin: Negative for flushing, nail changes, rash and suspicious lesions.  Musculoskeletal: Negative for arthritis, joint pain, muscle cramps, myalgias, neck pain and stiffness.  Gastrointestinal: Negative for abdominal pain, bowel incontinence, diarrhea and excessive appetite.  Genitourinary: Negative for decreased libido, genital sores and incomplete emptying.  Neurological: Negative for brief paralysis, focal weakness, headaches and loss of balance.  Psychiatric/Behavioral: Negative for altered mental status, depression and suicidal ideas.  Allergic/Immunologic: Negative for HIV exposure and persistent infections.    EKGs/Labs/Other Studies Reviewed:    The following studies were reviewed today:   EKG:  The ekg ordered today demonstrates sinus rhythm, heart rate 80 bpm  Recent Labs: 11/09/2022: ALT 32; BUN 25; Creatinine, Ser 1.01; Magnesium 2.1; Potassium 5.0; Sodium 140  Recent Lipid Panel No results found for: "CHOL", "TRIG", "HDL", "CHOLHDL", "VLDL", "LDLCALC", "LDLDIRECT"  Physical Exam:    VS:  BP (!) 142/60 (BP Location: Right Arm, Patient Position: Sitting, Cuff Size: Normal)   Pulse 64   Ht 5\' 2"  (1.575 m)   Wt 162 lb (73.5 kg)   SpO2 98%   BMI 29.63 kg/m     Wt Readings from Last 3 Encounters:  03/08/23 162 lb (73.5 kg)  11/09/22 160 lb (72.6 kg)  12/30/16 165 lb 9.6 oz (75.1 kg)     GEN: Well nourished, well developed in no acute distress HEENT: Normal NECK: No  JVD; No carotid bruits LYMPHATICS: No lymphadenopathy CARDIAC: S1S2 noted,RRR, no murmurs, rubs, gallops RESPIRATORY:  Clear to auscultation without rales, wheezing or rhonchi  ABDOMEN: Soft, non-tender, non-distended, +bowel sounds, no guarding. EXTREMITIES: No edema, No cyanosis, no clubbing MUSCULOSKELETAL:  No deformity  SKIN: Warm and dry NEUROLOGIC:  Alert and oriented x 3, non-focal PSYCHIATRIC:  Normal affect, good insight  ASSESSMENT:    1. Minimal CAD   2. Essential  hypertension     PLAN:    CAD-no angina.  Continue Zetia for now.  LDL goal is less than 70.  Hypertension-continue current medication regimen.  Shared decision we will keep the patient at a systolic of 140 to avoid hypotension.   The patient is in agreement with the above  plan. The patient left the office in stable condition.  The patient will follow up in neuro sooner if needed.   Medication Adjustments/Labs and Tests Ordered: Current medicines are reviewed at length with the patient today.  Concerns regarding medicines are outlined above.  No orders of the defined types were placed in this encounter.  No orders of the defined types were placed in this encounter.   Patient Instructions  Medication Instructions:  No Changes  Lab Work: Will have labs drawn at [facility].   Testing/Procedures: None  Follow-Up: At [facility], you and your health needs are our priority.  As part of our continuing mission to provide you with exceptional heart care, we have created designated Provider Care Teams.  These Care Teams include your primary Cardiologist (physician) and Advanced Practice Providers (APPs -  Physician Assistants and Nurse Practitioners) who all work together to provide you with the care you need, when you need it.  We recommend signing up for the patient portal called "MyChart".  Sign up information is provided on this After Visit Summary.  MyChart is used to connect with  patients for Virtual Visits (Telemedicine).  Patients are able to view lab/test results, encounter notes, upcoming appointments, etc.  Non-urgent messages can be sent to your provider as well.   To learn more about what you can do with MyChart, go to [email or web address].    Your next appointment:   12 month(s)  Provider:   Thomasene Ripple, DO           Adopting a Healthy Lifestyle.  Know what a healthy weight is for you (roughly BMI <25) and aim to maintain this   Aim for 7+ servings of fruits and vegetables daily   65-80+ fluid ounces of water or unsweet tea for healthy kidneys   Limit to max 1 drink of alcohol per day; avoid smoking/tobacco   Limit animal fats in diet for cholesterol and heart health - choose grass fed whenever available   Avoid highly processed foods, and foods high in saturated/trans fats   Aim for low stress - take time to unwind and care for your mental health   Aim for 150 min of moderate intensity exercise weekly for heart health, and weights twice weekly for bone health   Aim for 7-9 hours of sleep daily   When it comes to diets, agreement about the perfect plan isnt easy to find, even among the experts. Experts at the [facility] [employer] developed an idea known as the Healthy Eating Plate. Just imagine a plate divided into logical, healthy portions.   The emphasis is on diet quality:   Load up on vegetables and fruits - one-half of your plate: Aim for color and variety, and remember that potatoes dont count.   Go for whole grains - one-quarter of your plate: Whole wheat, barley, wheat berries, quinoa, oats, brown rice, and foods made with them. If you want pasta, go with whole wheat pasta.   Protein power - one-quarter of your plate: Fish, chicken, beans, and nuts are all healthy, versatile protein sources. Limit red meat.   The diet, however, does go beyond the plate, offering a few other suggestions.   Use healthy plant oils,  such as olive, canola, soy, corn, sunflower and peanut. Check the labels, and avoid partially hydrogenated oil, which have unhealthy trans fats.   If youre thirsty, drink water. Coffee and tea are good in moderation, but skip sugary drinks and limit milk  and dairy products to one or two daily servings.   The type of carbohydrate in the diet is more important than the amount. Some sources of carbohydrates, such as vegetables, fruits, whole grains, and beans-are healthier than others.   Finally, stay active  Signed, Thomasene Ripple, DO  03/08/2023 10:33 AM    [facility]

## 2023-03-14 DIAGNOSIS — Z79899 Other long term (current) drug therapy: Secondary | ICD-10-CM | POA: Diagnosis not present

## 2023-03-14 LAB — LAB REPORT - SCANNED: EGFR: 55

## 2023-06-06 DIAGNOSIS — H40039 Anatomical narrow angle, unspecified eye: Secondary | ICD-10-CM | POA: Diagnosis not present

## 2023-06-06 DIAGNOSIS — H2513 Age-related nuclear cataract, bilateral: Secondary | ICD-10-CM | POA: Diagnosis not present

## 2023-06-14 DIAGNOSIS — Z0000 Encounter for general adult medical examination without abnormal findings: Secondary | ICD-10-CM | POA: Diagnosis not present

## 2023-06-14 DIAGNOSIS — I10 Essential (primary) hypertension: Secondary | ICD-10-CM | POA: Diagnosis not present

## 2023-06-14 DIAGNOSIS — M792 Neuralgia and neuritis, unspecified: Secondary | ICD-10-CM | POA: Diagnosis not present

## 2023-06-20 DIAGNOSIS — J069 Acute upper respiratory infection, unspecified: Secondary | ICD-10-CM | POA: Diagnosis not present

## 2023-06-20 DIAGNOSIS — M67431 Ganglion, right wrist: Secondary | ICD-10-CM | POA: Diagnosis not present

## 2023-08-26 DIAGNOSIS — R059 Cough, unspecified: Secondary | ICD-10-CM | POA: Diagnosis not present

## 2023-08-26 DIAGNOSIS — R0981 Nasal congestion: Secondary | ICD-10-CM | POA: Diagnosis not present

## 2023-08-26 DIAGNOSIS — B349 Viral infection, unspecified: Secondary | ICD-10-CM | POA: Diagnosis not present

## 2023-08-26 DIAGNOSIS — J029 Acute pharyngitis, unspecified: Secondary | ICD-10-CM | POA: Diagnosis not present

## 2023-09-14 DIAGNOSIS — I10 Essential (primary) hypertension: Secondary | ICD-10-CM | POA: Diagnosis not present

## 2023-09-14 DIAGNOSIS — E7800 Pure hypercholesterolemia, unspecified: Secondary | ICD-10-CM | POA: Diagnosis not present

## 2023-09-14 DIAGNOSIS — Z2821 Immunization not carried out because of patient refusal: Secondary | ICD-10-CM | POA: Diagnosis not present

## 2023-09-15 DIAGNOSIS — M65932 Unspecified synovitis and tenosynovitis, left forearm: Secondary | ICD-10-CM | POA: Diagnosis not present

## 2023-09-15 DIAGNOSIS — M19041 Primary osteoarthritis, right hand: Secondary | ICD-10-CM | POA: Diagnosis not present

## 2023-09-15 DIAGNOSIS — M19042 Primary osteoarthritis, left hand: Secondary | ICD-10-CM | POA: Diagnosis not present

## 2023-09-15 DIAGNOSIS — M67431 Ganglion, right wrist: Secondary | ICD-10-CM | POA: Diagnosis not present

## 2023-09-15 DIAGNOSIS — G5632 Lesion of radial nerve, left upper limb: Secondary | ICD-10-CM | POA: Diagnosis not present

## 2023-10-24 ENCOUNTER — Encounter: Payer: Self-pay | Admitting: Gastroenterology

## 2023-10-25 DIAGNOSIS — I10 Essential (primary) hypertension: Secondary | ICD-10-CM | POA: Diagnosis not present

## 2023-10-25 DIAGNOSIS — Z2821 Immunization not carried out because of patient refusal: Secondary | ICD-10-CM | POA: Diagnosis not present

## 2023-12-12 ENCOUNTER — Encounter: Payer: Self-pay | Admitting: Gastroenterology

## 2023-12-12 ENCOUNTER — Ambulatory Visit: Admitting: Gastroenterology

## 2023-12-12 VITALS — BP 122/70 | HR 60 | Ht 62.0 in | Wt 159.0 lb

## 2023-12-12 DIAGNOSIS — R0789 Other chest pain: Secondary | ICD-10-CM

## 2023-12-12 DIAGNOSIS — K449 Diaphragmatic hernia without obstruction or gangrene: Secondary | ICD-10-CM

## 2023-12-12 DIAGNOSIS — K219 Gastro-esophageal reflux disease without esophagitis: Secondary | ICD-10-CM

## 2023-12-12 MED ORDER — PANTOPRAZOLE SODIUM 40 MG PO TBEC: 40.0000 mg | DELAYED_RELEASE_TABLET | Freq: Every day | ORAL | 3 refills | Status: DC

## 2023-12-12 NOTE — Patient Instructions (Signed)
 You have been scheduled for a Barium Esophogram at [facility] (1st floor of the hospital) on Tuesday, 12/27/23 at 10:00 am. Please arrive 30 minutes prior to your appointment for registration. Make certain not to have anything to eat or drink 3 hours prior to your test. If you need to reschedule for any reason, please contact radiology at [phone] to do so. __________________________________________________________________ A barium swallow is an examination that concentrates on views of the esophagus. This tends to be a double contrast exam (barium and two liquids which, when combined, create a gas to distend the wall of the oesophagus) or single contrast (non-ionic iodine based). The study is usually tailored to your symptoms so a good history is essential. Attention is paid during the study to the form, structure and configuration of the esophagus, looking for functional disorders (such as aspiration, dysphagia, achalasia, motility and reflux) EXAMINATION You may be asked to change into a gown, depending on the type of swallow being performed. A radiologist and radiographer will perform the procedure. The radiologist will advise you of the type of contrast selected for your procedure and direct you during the exam. You will be asked to stand, sit or lie in several different positions and to hold a small amount of fluid in your mouth before being asked to swallow while the imaging is performed .In some instances you may be asked to swallow barium coated marshmallows to assess the motility of a solid food bolus. The exam can be recorded as a digital or video fluoroscopy procedure. POST PROCEDURE It will take 1-2 days for the barium to pass through your system. To facilitate this, it is important, unless otherwise directed, to increase your fluids for the next 24-48hrs and to resume your normal diet.  This test typically takes about 30 minutes to  perform. __________________________________________________________________________________  We have sent the following medications to your pharmacy for you to pick up at your convenience: Pantoprazole  (Protonix ) 40 mg, take 1 tablet daily 20-30 minutes before meals.    Thank you for trusting me with your gastrointestinal care!   Camie Furbish, PA-C  _______________________________________________________  If your blood pressure at your visit was 140/90 or greater, please contact your primary care physician to follow up on this.  _______________________________________________________  If you are age 82 or older, your body mass index should be between 23-30. Your Body mass index is 29.08 kg/m. If this is out of the aforementioned range listed, please consider follow up with your Primary Care Provider.  If you are age 51 or younger, your body mass index should be between 19-25. Your Body mass index is 29.08 kg/m. If this is out of the aformentioned range listed, please consider follow up with your Primary Care Provider.   ________________________________________________________  The [facility] providers would like to encourage you to use MYCHART to communicate with providers for non-urgent requests or questions.  Due to long hold times on the telephone, sending your provider a message by [facility] may be a faster and more efficient way to get a response.  Please allow 48 business hours for a response.  Please remember that this is for non-urgent requests.  _______________________________________________________  [facility] is using a team-based approach to care.  Your team is made up of your doctor and two to three APPS. Our APPS (Nurse Practitioners and Physician Assistants) work with your physician to ensure care continuity for you. They are fully qualified to address your health concerns and develop a treatment plan. They communicate  directly with your gastroenterologist to  care for you. Seeing the Advanced Practice Practitioners on your physician's team can help you by facilitating care more promptly, often allowing for earlier appointments, access to diagnostic testing, procedures, and other specialty referrals.

## 2023-12-12 NOTE — Progress Notes (Unsigned)
 MAKYNZI EASTLAND [id number] 1940/04/05   Chief Complaint:  Referring Provider: Ina Marcellus RAMAN, MD Primary GI MD: Sampson (previous Dr. Obie)  HPI: KATYE VALEK is a 83 y.o. female with past medical history of anxiety, arthritis, asthma, hiatal hernia, HLD, HTN, hysterectomy, appendectomy who presents today for a complaint of *** .    Last seen in office 03/20/2013 by Dr. Obie for noncardiac chest pain.  Pain noted to improve with antacids or Nexium .  Endorsed dysphagia intermittently to certain foods and also sometimes to liquids.  She underwent EGD 03/28/2013 with findings of presbyesophagus, mild resistance at level of lower esophageal sphincter consistent with esophageal spasm, s/p dilation, 2 cm sliding hiatal hernia, antral gastritis.  Has a small sliding-type hiatal hernia seen on coronary CT 11/2022.   Taking Protonix ? Trouble swallowing?     Discussed the use of AI scribe software for clinical note transcription with the patient, who gave verbal consent to proceed.  History of Present Illness Ayesha Markwell Lockner is an 83 year old female with a history of hiatal hernia who presents with chest pain and reflux symptoms.  Chest pain and reflux symptoms - Persistent chest pain associated with hiatal hernia, spreading across the chest and worsening with stress - Pain occurs randomly, sometimes after eating, and at least every other day - Similar episodes since 2015; cardiac evaluation in the past ruled out cardiac etiology, with hiatal hernia as the only finding - Aldona identified as a dietary trigger for chest pain - Episodes of excessive burping and phlegm production, attributed to hernia - Allergist suggested congestion may be related to reflux from hernia  Gastroesophageal medication use and response - Currently taking Protonix  (pantoprazole ) 20 mg once daily; initially took every other day by personal preference - Protonix  was effective initially, but symptoms  have since recurred - Occasionally takes Tylenol  for severe pain, but is generally reluctant to use medications - Her daughter advises her on medication use  Esophageal dysmotility and swallowing difficulties - History of esophageal dysmotility and gastritis on prior upper endoscopy; biopsies were normal - Underwent esophageal dilation for swallowing difficulties, which have resolved except for a recent incident with a pill - No current dysphagia  Bowel habits and gastrointestinal bleeding - No changes in bowel habits - Dark stools occur only when consuming blueberries - No unintentional weight loss or changes in appetite  Colorectal cancer screening - No further colon cancer screening since colonoscopy in 2010      Previous GI Procedures/Imaging   EGD 03/28/2013 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 Pylori Chest pain likely related to esophageal spasm and presbyesophagus Path: Surgical Surgical [P], antrum, antrum, biopsy  - REACTIVE REACTIVE GASTROPATHY GASTROPATHY WITH ASSOCIATED ASSOCIATED CHRONIC CHRONIC INFLAMMATION. INFLAMMATION.  GLENWOOD PHLEGM WARTHIN-STARRY STAIN IS NEGATIVE NEGATIVE FOR HELICOBACTER HELICOBACTER PYLORI. PYLORI.  - NO INTESTINAL INTESTINAL METAPLASIA, METAPLASIA, DYSPLASIA, DYSPLASIA, OR MALIGNANCY.  Colonoscopy 10/15/2008 No polyps or cancers Normal colonoscopy S/p random biopsies to rule out microscopic colitis Recall 10 years  Past Medical History:  Diagnosis Date   Allergy    ANXIETY    ARTHRITIS    Arthritis    SHOULDERS   Asthma    AS A CHILD   Hiatal hernia    HYPERLIPIDEMIA    HYPERTENSION  Past Surgical History:  Procedure Laterality Date   ABDOMINAL HYSTERECTOMY     APPENDECTOMY     BREAST  BIOPSY Right 02/02/2022   MM RT BREAST BX W LOC DEV 1ST LESION IMAGE BX SPEC STEREO GUIDE 02/02/2022 [facility]   BREAST BIOPSY Right 08/11/2022   MM RT BREAST BX W LOC DEV 1ST LESION IMAGE BX SPEC STEREO GUIDE 08/11/2022 [facility]   CARPAL TUNNEL RELEASE     COLONOSCOPY     LUMBAR DISC SURGERY     TONSILLECTOMY      Current Outpatient Medications  Medication Sig Dispense Refill   amLODipine (NORVASC) 5 MG tablet Take 5 mg by mouth at bedtime.     amLODipine-olmesartan (AZOR) 5-40 MG tablet Take 1 tablet by mouth daily.     Biotin 89999 MCG TABS Take by mouth daily.     cyclobenzaprine (FLEXERIL) 10 MG tablet cyclobenzaprine 10 mg tablet  TAKE 1 TABLET EVERY 8 HOURS BY ORAL ROUTE AS NEEDED FOR SPASM FOR 10 DAYS.     diclofenac Sodium (VOLTAREN) 1 % GEL diclofenac 1 % topical gel  APPLY 1 GRAM TOPICALLY TO BOTH AFFECTED JOINTS EVERY DAY     ezetimibe (ZETIA) 10 MG tablet Take 10 mg by mouth daily.     finasteride (PROSCAR) 5 MG tablet Take 5 mg by mouth daily.     fluticasone (FLONASE) 50 MCG/ACT nasal spray Place 2 sprays into both nostrils 2 (two) times daily.     indapamide (LOZOL) 1.25 MG tablet Take 1.25 mg by mouth daily.     Multiple Vitamins-Minerals (HAIR SKIN NAILS PO) Take by mouth.     pantoprazole  (PROTONIX ) 20 MG tablet Take 20 mg by mouth daily.     Polyethyl Glycol-Propyl Glycol (SYSTANE OP) Apply to eye.     Current Facility-Administered Medications  Medication Dose Route Frequency Provider Last Rate Last Admin   triamcinolone  acetonide (KENALOG ) 10 MG/ML injection 10 mg  10 mg Other Once Stover, Titorya, DPM        Allergies as of 12/12/2023 - Review Complete 12/12/2023  Allergen Reaction Noted   Clarithromycin  12/30/2016   Naproxen     Tape     Latex Itching and Rash     Family History  Problem Relation Age of Onset   CAD Father        MI in his 72s   Diabetes Father    Heart attack Brother        MI in his 44s   CAD Sister    Colon polyps  Mother    Diabetes Sister    Diabetes Brother    Lung cancer Sister        smoker   Lung cancer Brother        smoker   Breast cancer Other        niece   Cervical cancer Other        niece   Cancer Maternal Uncle        type unknown   Coronary artery disease Brother    Allergy (severe) Son        Allergy to bees    Social History   Tobacco Use   Smoking status: Never   Smokeless tobacco: Never  Substance Use Topics   Alcohol use: No   Drug use: No     Review of Systems:    Constitutional: No weight loss, fever, chills, weakness or fatigue Eyes: No change in vision Ears, Nose, Throat:  No change in hearing or congestion Skin: No rash or itching Cardiovascular: No chest pain, chest pressure or palpitations   Respiratory: No SOB or cough Gastrointestinal: See HPI and otherwise negative Genitourinary: No dysuria or change in urinary frequency Neurological: No headache, dizziness or syncope Musculoskeletal: No new muscle or joint pain Hematologic: No bleeding or bruising    Physical Exam:  Vital signs: BP 122/70   Pulse 60   Ht 5' 2 (1.575 m)   Wt 159 lb (72.1 kg)   SpO2 100%   BMI 29.08 kg/m   Constitutional: NAD, Well developed, Well nourished, alert and cooperative Head:  Normocephalic and atraumatic.  Eyes: No scleral icterus. Conjunctiva pink. Mouth: No oral lesions. Respiratory: Respirations even and unlabored. Lungs clear to auscultation bilaterally.  No wheezes, crackles, or rhonchi.  Cardiovascular:  Regular rate and rhythm. No murmurs. No peripheral edema. Gastrointestinal:  Soft, nondistended, nontender. No rebound or guarding. Normal bowel sounds. No appreciable masses or hepatomegaly. Rectal:  Not performed.  Neurologic:  Alert and oriented x4;  grossly normal neurologically.  Skin:   Dry and intact without significant lesions or rashes. Psychiatric: Oriented to person, place and time. Demonstrates good judgement and reason without abnormal  affect or behaviors.   RELEVANT LABS AND IMAGING: CBC No results found for: WBC, RBC, HGB, HCT, PLT, MCV, MCH, MCHC, RDW, LYMPHSABS, MONOABS, EOSABS, BASOSABS  CMP     Component Value Date/Time   NA 140 11/09/2022 1229   K 5.0 11/09/2022 1229   CL 101 11/09/2022 1229   CO2 22 11/09/2022 1229   GLUCOSE 113 (H) 11/09/2022 1229   BUN 25 11/09/2022 1229   CREATININE 1.01 (H) 11/09/2022 1229   CALCIUM 9.6 11/09/2022 1229   PROT 7.1 11/09/2022 1229   ALBUMIN 4.6 11/09/2022 1229   AST 33 11/09/2022 1229   ALT 32 11/09/2022 1229   ALKPHOS 119 11/09/2022 1229   BILITOT 0.3 11/09/2022 1229     Assessment/Plan:    Assessment & Plan Hiatal hernia with associated reflux symptoms Chronic hiatal hernia with reflux symptoms managed with pantoprazole . Previous cardiac causes ruled out. Surgery not indicated. - Increased pantoprazole  to 40 mg daily, taken 30 minutes before breakfast. - Ordered barium swallow to evaluate esophagus and stomach. - Consider anti-spasmodic medication if symptoms persist. - Consider upper endoscopy if barium swallow shows concerning findings.  Esophageal dysmotility Previous esophageal dilation. No significant swallowing difficulties currently reported. - Consider barium swallow to assess esophageal function.  Gastritis Previous biopsies unremarkable. Symptoms managed with pantoprazole . - Continue pantoprazole  as adjusted for hiatal hernia management.   Optimize PPI therapy Consider barium swallow versus EGD depending on symptoms (Thursday afternoon if possible, has appt in [place] at 11 AM)      Camie Furbish, PA-C  Gastroenterology 12/12/2023, 10:51 AM  Patient Care Team: Ina Marcellus RAMAN, MD as PCP - General (Family Medicine) Tobb, Kardie, DO as PCP - Cardiology (Cardiology)

## 2023-12-13 ENCOUNTER — Encounter: Payer: Self-pay | Admitting: Gastroenterology

## 2023-12-15 NOTE — Progress Notes (Signed)
 Attending Physician's Attestation   I have reviewed the chart.   I agree with the Advanced Practitioner's note, impression, and recommendations with any updates as below. Agree with noninvasive testing for now with barium swallow and consider possible endoscopic evaluation thereafter.   Aloha Finner, MD [facility] Advanced Endoscopy Office # [phone]

## 2023-12-27 ENCOUNTER — Ambulatory Visit: Payer: Self-pay | Admitting: Gastroenterology

## 2023-12-27 ENCOUNTER — Ambulatory Visit (HOSPITAL_COMMUNITY)
Admission: RE | Admit: 2023-12-27 | Discharge: 2023-12-27 | Disposition: A | Discharge status: Home / Self Care | Source: Ambulatory Visit | Attending: Gastroenterology | Admitting: Gastroenterology

## 2023-12-27 DIAGNOSIS — K449 Diaphragmatic hernia without obstruction or gangrene: Secondary | ICD-10-CM | POA: Diagnosis present

## 2023-12-27 DIAGNOSIS — K219 Gastro-esophageal reflux disease without esophagitis: Secondary | ICD-10-CM | POA: Diagnosis present

## 2023-12-27 DIAGNOSIS — R0789 Other chest pain: Secondary | ICD-10-CM | POA: Insufficient documentation

## 2023-12-28 ENCOUNTER — Telehealth: Payer: Self-pay | Admitting: Gastroenterology

## 2023-12-28 NOTE — Telephone Encounter (Signed)
 Inbound call from patient requesting to speak to the nurse in regards to a call she received. Patient is requesting a call back. Please advise.

## 2023-12-28 NOTE — Telephone Encounter (Signed)
 Patient needed to change the appointment date from 01/24/24. She has another appointment. Moved to 01/20/24.

## 2024-01-20 ENCOUNTER — Ambulatory Visit: Admitting: Gastroenterology

## 2024-01-20 ENCOUNTER — Encounter: Payer: Self-pay | Admitting: Gastroenterology

## 2024-01-20 VITALS — BP 116/60 | HR 84 | Ht 60.75 in | Wt 161.4 lb

## 2024-01-20 DIAGNOSIS — K449 Diaphragmatic hernia without obstruction or gangrene: Secondary | ICD-10-CM

## 2024-01-20 DIAGNOSIS — R0789 Other chest pain: Secondary | ICD-10-CM | POA: Diagnosis not present

## 2024-01-20 DIAGNOSIS — K224 Dyskinesia of esophagus: Secondary | ICD-10-CM

## 2024-01-20 DIAGNOSIS — K219 Gastro-esophageal reflux disease without esophagitis: Secondary | ICD-10-CM

## 2024-01-20 MED ORDER — PANTOPRAZOLE SODIUM 20 MG PO TBEC: 20.0000 mg | DELAYED_RELEASE_TABLET | Freq: Every day | ORAL | 3 refills | Status: AC

## 2024-01-20 NOTE — Progress Notes (Signed)
 "  Stacey Patterson [id number] Nov 11, 1940   Chief Complaint: GERD, chest pain  Referring Provider: Ina Marcellus RAMAN, MD Primary GI MD: Dr. Wilhelmenia  HPI: Stacey Patterson is a 84 y.o. female with past medical history of anxiety, arthritis, asthma, hiatal hernia, HLD, HTN, hysterectomy, appendectomy who presents today for follow up.    Seen in office 03/20/2013 by Dr. Obie for noncardiac chest pain.  Pain noted to improve with antacids or Nexium .  Endorsed dysphagia intermittently to certain foods and also sometimes to liquids.   She underwent EGD 03/28/2013 with findings of presbyesophagus, mild resistance at level of lower esophageal sphincter consistent with esophageal spasm, s/p dilation, 2 cm sliding hiatal hernia, antral gastritis.   Has a small sliding-type hiatal hernia seen on coronary CT 11/2022.   Last seen in office 12/12/2023 for complaint of chest pain and reflux symptoms.  Endorsed persistent chest pain associated with her hiatal hernia spreading across the chest and worsening with stress.  Symptoms occurring at least every other day, sometimes after eating.  Had similar episodes since 2015 with cardiac evaluation in the past that ruled out cardiac etiology, hiatal hernia as the only finding.  Was taking pantoprazole  20 mg daily which was effective initially but symptoms have since recurred.  Denied any dysphagia. Plan at last visit was to increase pantoprazole  to 40 mg daily, ordered barium swallow for further evaluation with consider for upper endoscopy if any concerning findings.  Barium swallow showed nonspecific esophageal dysmotility disorder with disrupted primary peristaltic waves in the mid to distal esophagus, without definite stricture, small type I hiatal hernia.   Discussed the use of AI scribe software for clinical note transcription with the patient, who gave verbal consent to proceed.  History of Present Illness Stacey Patterson is an 84 year old female with  esophageal dysmotility and prior esophageal stricture who presents for follow-up of gastroesophageal reflux.  Gastroesophageal Reflux and Esophageal Dysmotility: - Recent barium swallow study showed no definite stricture or significant narrowing, but esophageal spasm was observed - History of esophageal dilation  - Pantoprazole  recently increased to 40 mg daily, but she prefers and finds relief with 20 mg daily on an empty stomach - Using leftover 20 mg tablets with good effect - No increased reflux symptoms while on pantoprazole  - No odynophagia - No pain after eating  Chest Pain: - Intermittent chest pain persists, typically associated with physical activity such as standing, cooking, or cleaning - Pain is relieved by rest - Sometimes tender to palpation over the ribs - No concern for cardiac etiology; prior cardiology evaluation was unremarkable and she continues to follow with a cardiologist  Dysphagia: - No ongoing dysphagia except for a single recent episode with a pill that was difficult to swallow during barium esophagram - Usually does not have trouble swallowing pills, but sometimes requires extra water  Bowel Habits: - No abdominal pain - No melena or dark, tarry stools   Previous GI Procedures/Imaging   Barium swallow 12/27/2023 IMPRESSION: 1. Nonspecific esophageal dysmotility disorder with disrupted primary peristaltic waves in the mid to distal esophagus, without definite stricture. 2. Small type 1 hiatal hernia. 3. Cervical spondylosis and degenerative disc disease, and thoracic aortic arch atherosclerotic calcifications.  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 Pylori  Chest pain likely related to esophageal spasm and  presbyesophagus Path: Surgical Surgical [P], antrum, antrum, biopsy  - REACTIVE REACTIVE GASTROPATHY GASTROPATHY WITH ASSOCIATED ASSOCIATED CHRONIC CHRONIC INFLAMMATION. INFLAMMATION.  GLENWOOD PHLEGM WARTHIN-STARRY STAIN IS NEGATIVE NEGATIVE FOR HELICOBACTER HELICOBACTER PYLORI. PYLORI.  - NO INTESTINAL INTESTINAL METAPLASIA, METAPLASIA, DYSPLASIA, DYSPLASIA, OR MALIGNANCY.   Colonoscopy 10/15/2008 No polyps or cancers Normal colonoscopy S/p random biopsies to rule out microscopic colitis Recall 10 years   Past Medical History:  Diagnosis Date   Allergy    ANXIETY    ARTHRITIS    Arthritis    SHOULDERS   Asthma    AS A CHILD   Hiatal hernia    HYPERLIPIDEMIA    HYPERTENSION     Past Surgical History:  Procedure Laterality Date   ABDOMINAL HYSTERECTOMY     APPENDECTOMY     BREAST BIOPSY Right 02/02/2022   MM RT BREAST BX W LOC DEV 1ST LESION IMAGE BX SPEC STEREO GUIDE 02/02/2022 [facility]   BREAST BIOPSY Right 08/11/2022   MM RT BREAST BX W LOC DEV 1ST LESION IMAGE BX SPEC STEREO GUIDE 08/11/2022 [facility]   CARPAL TUNNEL RELEASE     COLONOSCOPY     LUMBAR DISC SURGERY     TONSILLECTOMY      Current Outpatient Medications  Medication Sig Dispense Refill   amLODipine (NORVASC) 5 MG tablet Take 5 mg by mouth at bedtime.     amLODipine-olmesartan (AZOR) 5-40 MG tablet Take 1 tablet by mouth daily.     Biotin 89999 MCG TABS Take by mouth daily.     cyclobenzaprine (FLEXERIL) 10 MG tablet cyclobenzaprine 10 mg tablet  TAKE 1 TABLET EVERY 8 HOURS BY ORAL ROUTE AS NEEDED FOR SPASM FOR 10 DAYS.     diclofenac Sodium (VOLTAREN) 1 % GEL diclofenac 1 % topical gel  APPLY 1 GRAM TOPICALLY TO BOTH AFFECTED JOINTS EVERY DAY     ezetimibe (ZETIA) 10 MG tablet Take 10 mg by mouth daily.     finasteride (PROSCAR) 5 MG tablet Take 5 mg by mouth daily.     fluticasone (FLONASE) 50 MCG/ACT nasal spray Place 2 sprays into both nostrils 2 (two) times daily.     indapamide  (LOZOL) 1.25 MG tablet Take 1.25 mg by mouth daily.     Multiple Vitamins-Minerals (HAIR SKIN NAILS PO) Take by mouth.     pantoprazole  (PROTONIX ) 40 MG tablet Take 1 tablet (40 mg total) by mouth daily. 30 tablet 3   Polyethyl Glycol-Propyl Glycol (SYSTANE OP) Apply to eye.     Polyethylene Glycol 400 (BLINK TEARS OP) Apply 1 drop to eye as needed.     Current Facility-Administered Medications  Medication Dose Route Frequency Provider Last Rate Last Admin   triamcinolone  acetonide (KENALOG ) 10 MG/ML injection 10 mg  10 mg Other Once Stover, Titorya, DPM        Allergies as of 01/20/2024 - Review Complete 01/20/2024  Allergen Reaction Noted   Clarithromycin  12/30/2016   Naproxen     Tape     Latex Itching and Rash     Family History  Problem Relation Age of Onset   CAD Father        MI in his 71s   Diabetes Father    Heart attack Brother        MI in his 22s   CAD Sister    Colon polyps Mother    Diabetes Sister    Diabetes Brother  Lung cancer Sister        smoker   Lung cancer Brother        smoker   Breast cancer Other        niece   Cervical cancer Other        niece   Cancer Maternal Uncle        type unknown   Coronary artery disease Brother    Allergy (severe) Son        Allergy to bees    Social History[1]   Review of Systems:    Constitutional: No weight loss, fever, chills Cardiovascular: Intermittent chest pain, stable without worsening Respiratory: No SOB  Gastrointestinal: See HPI and otherwise negative   Physical Exam:  Vital signs: BP 116/60 (BP Location: Left Arm, Patient Position: Sitting, Cuff Size: Normal)   Pulse 84   Ht 5' 0.75 (1.543 m) Comment: height measured without shoes  Wt 161 lb 6 oz (73.2 kg)   BMI 30.74 kg/m   Wt Readings from Last 3 Encounters:  01/20/24 161 lb 6 oz (73.2 kg)  12/12/23 159 lb (72.1 kg)  03/08/23 162 lb (73.5 kg)     Constitutional: Pleasant, overweight female in NAD, alert and cooperative Head:   Normocephalic and atraumatic.  Respiratory: Respirations even and unlabored. Lungs clear to auscultation bilaterally.  No wheezes, crackles, or rhonchi.  Cardiovascular:  Regular rate and rhythm. No murmurs. No peripheral edema. Gastrointestinal:  Soft, nondistended, nontender. No rebound or guarding. Normal bowel sounds. No appreciable masses or hepatomegaly. Rectal:  Not performed.  Neurologic:  Alert and oriented x4;  grossly normal neurologically.  Skin:   Dry and intact without significant lesions or rashes. Psychiatric: Oriented to person, place and time. Demonstrates good judgement and reason without abnormal affect or behaviors.  Assessment/Plan:   Assessment & Plan Gastroesophageal reflux disease Atypical chest pain Symptoms controlled with pantoprazole  20 mg daily.  After being prescribed pantoprazole  40 mg daily, patient realized she had been taking her 20 mg dose with meals rather than on an empty stomach.  Went back to 20 mg dose on empty stomach and this controls her symptoms.  No complications or alarm features.  - Reduced pantoprazole  to 20 mg daily - Advised monitoring for worsening symptoms: increased reflux, dysphagia, postprandial pain. - Instructed follow-up with primary care for prescription management if preferred. - Scheduled gastroenterology follow-up in three months, revisit EGD at that time if symptoms persist or worsen  Esophageal dysmotility Barium swallow confirmed dysmotility without stricture. Symptoms stable without dysphagia. EGD deferred at this time.  - Reviewed barium swallow results confirming dysmotility without stricture. - Advised monitoring for new or worsening dysphagia, odynophagia, postprandial pain, which would prompt reconsideration of EGD. - Scheduled gastroenterology follow-up in three months.   Camie Furbish, PA-C [facility] 01/20/2024, 11:02 AM  Patient Care Team: Ina Marcellus RAMAN, MD as PCP - General (Family  Medicine) Tobb, Kardie, DO as PCP - Cardiology (Cardiology)       [1]  Social History Tobacco Use   Smoking status: Never   Smokeless tobacco: Never  Substance Use Topics   Alcohol use: No   Drug use: No   "

## 2024-01-20 NOTE — Patient Instructions (Addendum)
 _______________________________________________________  If your blood pressure at your visit was 140/90 or greater, please contact your primary care physician to follow up on this.  _______________________________________________________  If you are age 83 or older, your body mass index should be between 23-30. Your Body mass index is 30.74 kg/m. If this is out of the aforementioned range listed, please consider follow up with your Primary Care Provider.  If you are age 9 or younger, your body mass index should be between 19-25. Your Body mass index is 30.74 kg/m. If this is out of the aformentioned range listed, please consider follow up with your Primary Care Provider.   ________________________________________________________  The [facility] providers would like to encourage you to use MYCHART to communicate with providers for non-urgent requests or questions.  Due to long hold times on the telephone, sending your provider a message by [facility] may be a faster and more efficient way to get a response.  Please allow 48 business hours for a response.  Please remember that this is for non-urgent requests.  _______________________________________________________  [facility] is using a team-based approach to care.  Your team is made up of your doctor and two to three APPS. Our APPS (Nurse Practitioners and Physician Assistants) work with your physician to ensure care continuity for you. They are fully qualified to address your health concerns and develop a treatment plan. They communicate directly with your gastroenterologist to care for you. Seeing the Advanced Practice Practitioners on your physician's team can help you by facilitating care more promptly, often allowing for earlier appointments, access to diagnostic testing, procedures, and other specialty referrals.   We have sent the following medications to your pharmacy for you to pick up at your convenience: Pantoprazole  20  mg

## 2024-01-24 ENCOUNTER — Ambulatory Visit: Admitting: Gastroenterology

## 2024-01-26 NOTE — Progress Notes (Signed)
"   Attending Physician's Attestation   I have reviewed the chart.   I agree with the Advanced Practitioner's note, impression, and recommendations with any updates as below. If symptoms of recurrent atypical chest pain occur then consideration of manometry to evaluate for jackhammer or nutcracker esophagus or diffuse esophageal spasm may be reasonable to consider in the setting of her abnormal barium study.  Glad she is not having symptoms as such currently.   Aloha Finner, MD [facility] Advanced Endoscopy Office # [phone]  "

## 2024-02-07 ENCOUNTER — Ambulatory Visit: Admitting: Gastroenterology

## 2024-03-09 ENCOUNTER — Ambulatory Visit: Payer: Self-pay | Admitting: Cardiology
